# Patient Record
Sex: Male | Born: 1950 | Race: White | Hispanic: No | Marital: Married | Smoking: Former smoker
Health system: Southern US, Community
[De-identification: ages and names within clinical notes are randomized; demographics above are authoritative.]

## PROBLEM LIST (undated history)

## (undated) DIAGNOSIS — I10 Essential (primary) hypertension: Secondary | ICD-10-CM

## (undated) DIAGNOSIS — E785 Hyperlipidemia, unspecified: Secondary | ICD-10-CM

## (undated) DIAGNOSIS — E119 Type 2 diabetes mellitus without complications: Secondary | ICD-10-CM

## (undated) HISTORY — DX: Essential (primary) hypertension: I10

## (undated) HISTORY — DX: Hyperlipidemia, unspecified: E78.5

## (undated) HISTORY — PX: HAND SURGERY: SHX662

## (undated) HISTORY — DX: Type 2 diabetes mellitus without complications: E11.9

## (undated) HISTORY — PX: KNEE SURGERY: SHX244

## (undated) HISTORY — PX: FOOT SURGERY: SHX648

---

## 2000-02-27 ENCOUNTER — Encounter: Payer: Self-pay | Admitting: Neurosurgery

## 2000-02-27 ENCOUNTER — Encounter: Admission: RE | Admit: 2000-02-27 | Discharge: 2000-02-27 | Payer: Self-pay | Admitting: Neurosurgery

## 2000-08-25 ENCOUNTER — Ambulatory Visit (HOSPITAL_COMMUNITY): Admission: RE | Admit: 2000-08-25 | Discharge: 2000-08-25 | Payer: Self-pay | Admitting: Family Medicine

## 2000-08-25 ENCOUNTER — Encounter: Payer: Self-pay | Admitting: Family Medicine

## 2000-11-08 ENCOUNTER — Ambulatory Visit (HOSPITAL_COMMUNITY): Admission: RE | Admit: 2000-11-08 | Discharge: 2000-11-08 | Payer: Self-pay | Admitting: Family Medicine

## 2000-11-08 ENCOUNTER — Encounter: Payer: Self-pay | Admitting: Family Medicine

## 2002-06-01 ENCOUNTER — Encounter: Payer: Self-pay | Admitting: *Deleted

## 2002-06-01 ENCOUNTER — Emergency Department (HOSPITAL_COMMUNITY): Admission: EM | Admit: 2002-06-01 | Discharge: 2002-06-01 | Payer: Self-pay | Admitting: *Deleted

## 2019-07-06 ENCOUNTER — Inpatient Hospital Stay (HOSPITAL_COMMUNITY)
Admission: EM | Admit: 2019-07-06 | Discharge: 2019-07-08 | DRG: 066 | Disposition: A | Discharge status: Home / Self Care | Payer: Medicare Other | Attending: Internal Medicine | Admitting: Internal Medicine

## 2019-07-06 ENCOUNTER — Emergency Department (HOSPITAL_COMMUNITY): Payer: Medicare Other

## 2019-07-06 ENCOUNTER — Encounter (HOSPITAL_COMMUNITY): Payer: Self-pay | Admitting: Emergency Medicine

## 2019-07-06 ENCOUNTER — Other Ambulatory Visit: Payer: Self-pay

## 2019-07-06 DIAGNOSIS — I6521 Occlusion and stenosis of right carotid artery: Secondary | ICD-10-CM | POA: Diagnosis present

## 2019-07-06 DIAGNOSIS — Z8249 Family history of ischemic heart disease and other diseases of the circulatory system: Secondary | ICD-10-CM | POA: Diagnosis not present

## 2019-07-06 DIAGNOSIS — E119 Type 2 diabetes mellitus without complications: Secondary | ICD-10-CM

## 2019-07-06 DIAGNOSIS — Z91030 Bee allergy status: Secondary | ICD-10-CM

## 2019-07-06 DIAGNOSIS — Z7902 Long term (current) use of antithrombotics/antiplatelets: Secondary | ICD-10-CM | POA: Diagnosis not present

## 2019-07-06 DIAGNOSIS — Z20822 Contact with and (suspected) exposure to covid-19: Secondary | ICD-10-CM | POA: Diagnosis present

## 2019-07-06 DIAGNOSIS — E1165 Type 2 diabetes mellitus with hyperglycemia: Secondary | ICD-10-CM | POA: Diagnosis present

## 2019-07-06 DIAGNOSIS — Z79899 Other long term (current) drug therapy: Secondary | ICD-10-CM

## 2019-07-06 DIAGNOSIS — I10 Essential (primary) hypertension: Secondary | ICD-10-CM | POA: Diagnosis present

## 2019-07-06 DIAGNOSIS — F17210 Nicotine dependence, cigarettes, uncomplicated: Secondary | ICD-10-CM | POA: Diagnosis present

## 2019-07-06 DIAGNOSIS — R29810 Facial weakness: Secondary | ICD-10-CM | POA: Diagnosis present

## 2019-07-06 DIAGNOSIS — R4701 Aphasia: Secondary | ICD-10-CM | POA: Diagnosis not present

## 2019-07-06 DIAGNOSIS — I340 Nonrheumatic mitral (valve) insufficiency: Secondary | ICD-10-CM | POA: Diagnosis not present

## 2019-07-06 DIAGNOSIS — Z7289 Other problems related to lifestyle: Secondary | ICD-10-CM | POA: Diagnosis not present

## 2019-07-06 DIAGNOSIS — E785 Hyperlipidemia, unspecified: Secondary | ICD-10-CM | POA: Diagnosis not present

## 2019-07-06 DIAGNOSIS — I739 Peripheral vascular disease, unspecified: Secondary | ICD-10-CM | POA: Diagnosis not present

## 2019-07-06 DIAGNOSIS — I639 Cerebral infarction, unspecified: Secondary | ICD-10-CM | POA: Diagnosis not present

## 2019-07-06 DIAGNOSIS — E1365 Other specified diabetes mellitus with hyperglycemia: Secondary | ICD-10-CM | POA: Diagnosis not present

## 2019-07-06 DIAGNOSIS — I6381 Other cerebral infarction due to occlusion or stenosis of small artery: Principal | ICD-10-CM | POA: Diagnosis present

## 2019-07-06 DIAGNOSIS — Z716 Tobacco abuse counseling: Secondary | ICD-10-CM

## 2019-07-06 DIAGNOSIS — Z720 Tobacco use: Secondary | ICD-10-CM

## 2019-07-06 LAB — CBC WITH DIFFERENTIAL/PLATELET
Abs Immature Granulocytes: 0.02 10*3/uL (ref 0.00–0.07)
Basophils Absolute: 0.1 10*3/uL (ref 0.0–0.1)
Basophils Relative: 1 %
Eosinophils Absolute: 0.2 10*3/uL (ref 0.0–0.5)
Eosinophils Relative: 2 %
HCT: 47.5 % (ref 39.0–52.0)
Hemoglobin: 16.1 g/dL (ref 13.0–17.0)
Immature Granulocytes: 0 %
Lymphocytes Relative: 26 %
Lymphs Abs: 2.1 10*3/uL (ref 0.7–4.0)
MCH: 31.9 pg (ref 26.0–34.0)
MCHC: 33.9 g/dL (ref 30.0–36.0)
MCV: 94.1 fL (ref 80.0–100.0)
Monocytes Absolute: 0.7 10*3/uL (ref 0.1–1.0)
Monocytes Relative: 9 %
Neutro Abs: 4.9 10*3/uL (ref 1.7–7.7)
Neutrophils Relative %: 62 %
Platelets: 248 10*3/uL (ref 150–400)
RBC: 5.05 MIL/uL (ref 4.22–5.81)
RDW: 12.3 % (ref 11.5–15.5)
WBC: 8 10*3/uL (ref 4.0–10.5)
nRBC: 0 % (ref 0.0–0.2)

## 2019-07-06 LAB — COMPREHENSIVE METABOLIC PANEL
ALT: 24 U/L (ref 0–44)
AST: 19 U/L (ref 15–41)
Albumin: 3.9 g/dL (ref 3.5–5.0)
Alkaline Phosphatase: 88 U/L (ref 38–126)
Anion gap: 8 (ref 5–15)
BUN: 13 mg/dL (ref 8–23)
CO2: 28 mmol/L (ref 22–32)
Calcium: 8.9 mg/dL (ref 8.9–10.3)
Chloride: 102 mmol/L (ref 98–111)
Creatinine, Ser: 0.68 mg/dL (ref 0.61–1.24)
GFR calc Af Amer: >60 mL/min (ref >60)
GFR calc non Af Amer: >60 mL/min (ref >60)
Glucose, Bld: 189 mg/dL — ABNORMAL HIGH (ref 70–99)
Potassium: 3.7 mmol/L (ref 3.5–5.1)
Sodium: 138 mmol/L (ref 135–145)
Total Bilirubin: 0.6 mg/dL (ref 0.3–1.2)
Total Protein: 7.2 g/dL (ref 6.5–8.1)

## 2019-07-06 LAB — LIPID PANEL
Cholesterol: 203 mg/dL — ABNORMAL HIGH (ref 0–200)
HDL: 43 mg/dL (ref >40)
LDL Cholesterol: 141 mg/dL — ABNORMAL HIGH (ref 0–99)
Total CHOL/HDL Ratio: 4.7 RATIO
Triglycerides: 97 mg/dL (ref <150)
VLDL: 19 mg/dL (ref 0–40)

## 2019-07-06 LAB — HEMOGLOBIN A1C
Hgb A1c MFr Bld: 9 % — ABNORMAL HIGH (ref 4.8–5.6)
Mean Plasma Glucose: 211.6 mg/dL

## 2019-07-06 MED ORDER — AMLODIPINE BESYLATE 5 MG PO TABS
2.5000 mg | ORAL_TABLET | Freq: Once | ORAL | Status: AC
  Administered 2019-07-06: 2.5 mg via ORAL
  Filled 2019-07-06: qty 1

## 2019-07-06 MED ORDER — CLOPIDOGREL BISULFATE 75 MG PO TABS: 75.0000 mg | ORAL_TABLET | Freq: Every day | ORAL | 1 refills | Status: DC

## 2019-07-06 MED ORDER — ACETAMINOPHEN 160 MG/5ML PO SOLN: 650.0000 mg | ORAL | Status: DC | PRN

## 2019-07-06 MED ORDER — ASPIRIN EC 81 MG PO TBEC
81.0000 mg | DELAYED_RELEASE_TABLET | Freq: Every day | ORAL | Status: DC
  Administered 2019-07-07: 81 mg via ORAL
  Filled 2019-07-06: qty 1

## 2019-07-06 MED ORDER — ASPIRIN 81 MG PO CHEW
324.0000 mg | CHEWABLE_TABLET | Freq: Once | ORAL | Status: AC
  Administered 2019-07-06: 324 mg via ORAL
  Filled 2019-07-06: qty 4

## 2019-07-06 MED ORDER — IOHEXOL 350 MG/ML SOLN
100.0000 mL | Freq: Once | INTRAVENOUS | Status: AC | PRN
  Administered 2019-07-06: 80 mL via INTRAVENOUS

## 2019-07-06 MED ORDER — INSULIN ASPART 100 UNIT/ML ~~LOC~~ SOLN
0.0000 [IU] | Freq: Three times a day (TID) | SUBCUTANEOUS | Status: DC
  Administered 2019-07-07: 2 [IU] via SUBCUTANEOUS
  Administered 2019-07-07 – 2019-07-08 (×2): 3 [IU] via SUBCUTANEOUS
  Administered 2019-07-08: 5 [IU] via SUBCUTANEOUS

## 2019-07-06 MED ORDER — ACETAMINOPHEN 325 MG PO TABS: 650.0000 mg | ORAL_TABLET | ORAL | Status: DC | PRN

## 2019-07-06 MED ORDER — SENNOSIDES-DOCUSATE SODIUM 8.6-50 MG PO TABS: 1.0000 | ORAL_TABLET | Freq: Every evening | ORAL | Status: DC | PRN

## 2019-07-06 MED ORDER — HYDRALAZINE HCL 20 MG/ML IJ SOLN
10.0000 mg | INTRAMUSCULAR | Status: DC | PRN
  Administered 2019-07-07: 10 mg via INTRAVENOUS
  Filled 2019-07-06: qty 1

## 2019-07-06 MED ORDER — ATORVASTATIN CALCIUM 40 MG PO TABS
40.0000 mg | ORAL_TABLET | Freq: Every day | ORAL | Status: DC
  Administered 2019-07-06: 40 mg via ORAL
  Filled 2019-07-06: qty 1

## 2019-07-06 MED ORDER — AMLODIPINE BESYLATE 5 MG PO TABS
5.0000 mg | ORAL_TABLET | Freq: Every day | ORAL | Status: DC
  Administered 2019-07-06 – 2019-07-08 (×3): 5 mg via ORAL
  Filled 2019-07-06 (×3): qty 1

## 2019-07-06 MED ORDER — ACETAMINOPHEN 650 MG RE SUPP: 650.0000 mg | RECTAL | Status: DC | PRN

## 2019-07-06 MED ORDER — AMLODIPINE BESYLATE 2.5 MG PO TABS: 2.5000 mg | ORAL_TABLET | Freq: Every day | ORAL | 0 refills | Status: DC

## 2019-07-06 MED ORDER — HEPARIN SODIUM (PORCINE) 5000 UNIT/ML IJ SOLN
5000.0000 [IU] | Freq: Three times a day (TID) | INTRAMUSCULAR | Status: DC
  Administered 2019-07-06 – 2019-07-08 (×5): 5000 [IU] via SUBCUTANEOUS
  Filled 2019-07-06 (×5): qty 1

## 2019-07-06 MED ORDER — STROKE: EARLY STAGES OF RECOVERY BOOK
Freq: Once | Status: AC
  Administered 2019-07-06: 1
  Filled 2019-07-06: qty 1

## 2019-07-06 NOTE — ED Provider Notes (Signed)
  Physical Exam  BP (!) 206/98   Pulse 79   Temp 98.2 F (36.8 C) (Oral)   Resp 16   Ht 6\' 2"  (1.88 m)   Wt 97.5 kg   SpO2 95%   BMI 27.60 kg/m   Physical Exam  ED Course/Procedures     Procedures  MDM  Patient presents with slurred speech since Monday.  Initial head CT showed possible stroke.  MRI was recommended, however MRI machine is broken up here.  Patient will does not want to be admitted the hospital so CTA was done hopeful for outpatient follow-up after discussion with hospitalist and neurology.  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  Discussed with Dr. Tuesday with neurology.  Will require MRI short-term and not as an outpatient.  Will admit to hospitalist for transfer to [facility] since MRI is broken here.  Potentially will require vascular surgery if there are any findings on the MRI to go along with the right side findings on CT.       [facility], MD 07/06/19 [phone]

## 2019-07-06 NOTE — Discharge Instructions (Addendum)
Take 81 mg daily and plavix 75 mg daily and follow-up closely with neurology on Monday call for appointment. Take amlodipine as directed daily for blood pressure and have it rechecked next week. Return for any new or worsening stroke symptoms. Have blood pressure rechecked by primary doctor.  Follow up ultrasound results with neurology.

## 2019-07-06 NOTE — ED Notes (Signed)
Pt provided [employer] while awaiting transport to [facility] per MD request.

## 2019-07-06 NOTE — H&P (Addendum)
History and Physical    Samuel Bradshaw GNF:[id number] DOB: 04/02/1951 DOA: 07/06/2019  PCP: Patient, No Pcp Per   Patient coming from: Home  I have personally briefly reviewed patient's old medical records in [facility]  Chief Complaint: Speech abnormality  HPI: Samuel Bradshaw is a 69 y.o. male with medical history significant for hypertension, presented to the ED with complaints of slurred speech that started on Monday, 3 days ago, when he woke up in the morning, since onset, slurred speech has improved.  But he reports delay in forming words and verbalizing them. The next day- Tuesday, Spouse also noticed some facial droop which she thought involved the left side, but patient feels it involved the right side of his face.  He denies abnormal sensation on his face or extremities.  No weakness of his lower upper extremity.  He has been able to ambulate without assistance or difficulty. He has not seen a doctor in several years, he is not on any medications.  He does not take aspirin.  He currently smokes cigarettes.  ED Course: Blood pressure systolic elevated up to 204, unremarkable CBC, CMP. EKG shows sinus rhythm. Stat head CT-acute to subacute left periventricular white matter infarct suspected. Unable to do MRI at [facility] (not functioning). Initially patient did not want to be admitted to [facility].  Plan was to complete work-up with hemoglobin A1c lipid panel carotids and echo in the ED and discharged to home. But CTA head and neck showed positive bulky soft bleed at the right ICA bulb with high-grade stenosis numerically admitted at 70%, superimposed mild to mod distal right ICA stenosis. Also moderate to severe stenosis at the left ICA origin. EDP talked to Dr. Amada Jupiter, recommended admission to [facility] for MRI, pending imaging results may need vascular surgery evaluation also.  Review of Systems: As per HPI all other systems reviewed and negative.  History reviewed.  No pertinent past medical history.  History reviewed. No pertinent surgical history.   reports that he has been smoking cigarettes. He has a 25.00 pack-year smoking history. He has never used smokeless tobacco. He reports current alcohol use of about 6.0 standard drinks of alcohol per week. No history on file for drug.  Allergies  Allergen Reactions  . Bee Venom    Family history of hypertension, heart disease in father.  Prior to Admission medications   Medication Sig Start Date End Date Taking? Authorizing Provider  ibuprofen (ADVIL) 200 MG tablet Take 600 mg by mouth every 6 (six) hours as needed for moderate pain. -  as needed.   Yes [provider]  amLODipine (NORVASC) 2.5 MG tablet Take 1 tablet (2.5 mg total) by mouth daily. 07/06/19   Blane Ohara, MD  clopidogrel (PLAVIX) 75 MG tablet Take 1 tablet (75 mg total) by mouth daily. 07/06/19   Blane Ohara, MD    Physical Exam: Vitals:   07/06/19 1500 07/06/19 1600 07/06/19 1800 07/06/19 1801  BP: (!) 206/98  (!) 204/115   Pulse: 66 79  65  Resp: Temp:   97.8 F (36.6 C)   TempSrc:      SpO2: 96% 95%  100%  Weight:      Height:        Constitutional: NAD, calm, comfortable Vitals:   07/06/19 1500 07/06/19 1600 07/06/19 1800 07/06/19 1801  BP: (!) 206/98  (!) 204/115   Pulse: 66 79  65  Resp: 20 16 15  Temp:   97.8 F (36.6 C)   TempSrc:      SpO2: 96% 95%  100%  Weight:      Height:       Eyes: PERRL, lids and conjunctivae normal ENMT: Mucous membranes are moist.  Neck: normal, supple, no masses, no thyromegaly Respiratory: clear to auscultation bilaterally, no wheezing, no crackles. Normal respiratory effort. No accessory muscle use.  Cardiovascular: Regular rate and rhythm, no murmurs / rubs / gallops. No extremity edema. 2+ pedal pulses. No carotid bruits.  Abdomen: no tenderness, no masses palpated. No hepatosplenomegaly. Bowel sounds positive.  Musculoskeletal: no clubbing  / cyanosis. No joint deformity upper and lower extremities. Good ROM, no contractures. Normal muscle tone.  Skin: no rashes, lesions, ulcers. No induration Neurologic: Slurred speech not very apparent on my exam, speech understandable, mild right facial droop, central and peripheral vision intact, sensation intact face upper and lower extremities, normal finger-to-nose test, strength 5/5 in all 4, able to ambulate without assistance. Psychiatric: Normal judgment and insight. Alert and oriented x 3. Normal mood.   Labs on Admission: I have personally reviewed following labs and imaging studies  CBC: Recent Labs  Lab 07/06/19 1200  WBC 8.0  NEUTROABS 4.9  HGB 16.1  HCT 47.5  MCV 94.1  PLT 248   Basic Metabolic Panel: Recent Labs  Lab 07/06/19 1200  NA 138  K 3.7  CL 102  CO2 28  GLUCOSE 189*  BUN 13  CREATININE 0.68  CALCIUM 8.9   Liver Function Tests: Recent Labs  Lab 07/06/19 1200  AST 19  ALT 24  ALKPHOS 88  BILITOT 0.6  PROT 7.2  ALBUMIN 3.9   HbA1C: Recent Labs    07/06/19 1200  HGBA1C 9.0*   Lipid Profile: Recent Labs    07/06/19 1200  CHOL 203*  HDL 43  LDLCALC 141*  TRIG 97  CHOLHDL 4.7    Radiological Exams on Admission: CT Angio Head W or Wo Contrast  Result Date: 07/06/2019 CLINICAL DATA:  69 year old male with slurred speech upon waking today. EXAM: CT ANGIOGRAPHY HEAD AND NECK TECHNIQUE: Multidetector CT imaging of the head and neck was performed using the standard protocol during bolus administration of intravenous contrast. Multiplanar CT image reconstructions and MIPs were obtained to evaluate the vascular anatomy. Carotid stenosis measurements (when applicable) are obtained utilizing NASCET criteria, using the distal internal carotid diameter as the denominator. CONTRAST:  55mL OMNIPAQUE IOHEXOL 350 MG/ML SOLN COMPARISON:  Plain head CT 1219 hours today. FINDINGS: CT HEAD Brain: Hypodensity in the left basal ganglia appears somewhat  increased in conspicuity (series 4, image 17). No associated hemorrhage or mass effect. Mild for age cage in a white matter hypodensity elsewhere is stable. No midline shift, ventriculomegaly, mass effect, evidence of mass lesion, intracranial hemorrhage or evidence of cortically based acute infarction. No suspicious intracranial vascular hyperdensity. Calvarium and skull base: No acute osseous abnormality identified. Paranasal sinuses: Stable moderate to severe ethmoid and sphenoid sinus disease with mucosal thickening and opacification. Maxillary mucoperiosteal thickening. Tympanic cavities and mastoids remain well pneumatized. Orbits: No acute orbit or scalp soft tissue finding. CTA NECK Skeleton: Scattered dental caries. Lower cervical spine degeneration. No acute osseous abnormality identified. Upper chest: Negative. Other neck: Subcentimeter bilateral thyroid nodules do not meet consensus criteria for ultrasound follow-up. (Ref: J Am Coll Radiol. 2015 Feb;12(2): 143-50).Other neck soft tissues are within normal limits. Aortic arch: Calcified aortic atherosclerosis. Three vessel arch configuration. Right carotid system: Brachiocephalic artery and right CCA  origin plaque without stenosis. Only mild plaque at the right ICA origin but bulky circumferential soft plaque at the right ICA bulb resulting in stenosis numerically estimated at 70 % with respect to the distal vessel. See series 10, image 136 and series 9, image 222. Beyond this level the right ICA is patent with a normal caliber to the skull base. Left carotid system: Left CCA origin plaque without stenosis. Soft plaque in the medial left CCA proximal to the bifurcation without stenosis. Mild soft and calcified plaque in the proximal left ICA without stenosis to the skull base. Vertebral arteries: Minimal plaque in the proximal right subclavian artery without stenosis. Normal right vertebral artery origin. Patent right vertebral artery to the skull base  without stenosis. Bulky soft plaque in the proximal left subclavian artery (series 11, image 128) with 50 % stenosis with respect to the distal vessel. Mild soft plaque at the left vertebral artery origin without stenosis. The left vertebral appears mildly dominant and is then patent to the skull base without additional plaque. CTA HEAD Posterior circulation: Dominant left V4 segment. Mild to moderate left V4 segment plaque without significant stenosis. Patent PICA origins. No right V4 stenosis. Patent vertebrobasilar junction. Patent basilar artery without stenosis. Patent AICA, SCA and PCA origins. Posterior communicating arteries are diminutive or absent. Bilateral PCA branches are within normal limits. Anterior circulation: Both ICA siphons are patent. On the left there is mild calcified plaque without stenosis. Normal left ophthalmic artery origin. On the right there is moderate calcified plaque with mild to moderate supraclinoid segment stenosis (series 9, image 119). Normal right ophthalmic artery origin. Patent carotid termini. Normal MCA and right ACA origins. There is a moderate to severe stenosis at the left ACA origin (series 13, image 19). Anterior communicating artery and bilateral ACA branches are within normal limits. Right MCA M1 segment and bifurcation are patent without stenosis. Right MCA branches are within normal limits. Left MCA M1 segment and bifurcation are patent without stenosis. There is mild irregularity of the dominant left M2 branch on series 14, image 29 without significant stenosis. Venous sinuses: Patent, prominent left arachnoid granulation at the junction of the transverse and sigmoid sinuses (normal variant). Anatomic variants: Dominant left vertebral artery. Review of the MIP images confirms the above findings IMPRESSION: 1. Negative for large vessel occlusion. 2. Positive for bulky soft plaque at the Right ICA bulb with high-grade stenosis numerically estimated at 70%.  Superimposed mild to moderate distal right ICA supraclinoid stenosis due to calcified plaque. 3. Moderate to severe stenosis at the Left ACA origin. 4. No other hemodynamically significant large vessel stenosis. 5. Left basal ganglia lacunar infarct with no hemorrhage or mass effect may be acute to subacute. 6. No new intracranial abnormality. 7. Bilateral paranasal sinusitis. 8. Aortic Atherosclerosis (ICD10-I70.0). Electronically Signed   By: Genevie Ann M.D.   On: 07/06/2019 17:00   CT Head Wo Contrast  Result Date: 07/06/2019 CLINICAL DATA:  Woke up Monday morning with slurred speech. Still slightly slurred speech. EXAM: CT HEAD WITHOUT CONTRAST TECHNIQUE: Contiguous axial images were obtained from the base of the skull through the vertex without intravenous contrast. COMPARISON:  None. FINDINGS: Brain: The ventricles are in the midline without mass effect or shift. No extra-axial fluid collections identified. Patchy white matter changes, likely microvascular ischemic change. There is a slightly rounded area of low attenuation in the deep white periventricular white matter suspicious for a acute or subacute infarct. MR imaging is suggested for further evaluation. No  hemispheric infarction or intracranial hemorrhage. The brainstem and cerebellum appear normal. Vascular: Scattered vascular calcifications. No definite hyperdense vessels or aneurysm. Skull: No skull fracture or bone lesions. Sinuses/Orbits: Near complete opacification of the sphenoid sinus and fairly marked mucoperiosteal thickening involving the ethmoid air cells and maxillary sinuses. Scattered mastoid effusions are noted on the left side. Other: No scalp lesions or hematoma. IMPRESSION: 1. Suspect acute or subacute left periventricular white matter infarct. Recommend MR imaging for further evaluation. 2. No findings for hemispheric infarction or intracranial hemorrhage. 3. Paranasal sinus disease and left-sided mastoid effusions. Electronically  Signed   By: Rudie Meyer M.D.   On: 07/06/2019 12:31   CT Angio Neck W and/or Wo Contrast  Result Date: 07/06/2019 CLINICAL DATA:  69 year old male with slurred speech upon waking today. EXAM: CT ANGIOGRAPHY HEAD AND NECK TECHNIQUE: Multidetector CT imaging of the head and neck was performed using the standard protocol during bolus administration of intravenous contrast. Multiplanar CT image reconstructions and MIPs were obtained to evaluate the vascular anatomy. Carotid stenosis measurements (when applicable) are obtained utilizing NASCET criteria, using the distal internal carotid diameter as the denominator. CONTRAST:  19mL OMNIPAQUE IOHEXOL 350 MG/ML SOLN COMPARISON:  Plain head CT 1219 hours today. FINDINGS: CT HEAD Brain: Hypodensity in the left basal ganglia appears somewhat increased in conspicuity (series 4, image 17). No associated hemorrhage or mass effect. Mild for age cage in a white matter hypodensity elsewhere is stable. No midline shift, ventriculomegaly, mass effect, evidence of mass lesion, intracranial hemorrhage or evidence of cortically based acute infarction. No suspicious intracranial vascular hyperdensity. Calvarium and skull base: No acute osseous abnormality identified. Paranasal sinuses: Stable moderate to severe ethmoid and sphenoid sinus disease with mucosal thickening and opacification. Maxillary mucoperiosteal thickening. Tympanic cavities and mastoids remain well pneumatized. Orbits: No acute orbit or scalp soft tissue finding. CTA NECK Skeleton: Scattered dental caries. Lower cervical spine degeneration. No acute osseous abnormality identified. Upper chest: Negative. Other neck: Subcentimeter bilateral thyroid nodules do not meet consensus criteria for ultrasound follow-up. (Ref: J Am Coll Radiol. 2015 Feb;12(2): 143-50).Other neck soft tissues are within normal limits. Aortic arch: Calcified aortic atherosclerosis. Three vessel arch configuration. Right carotid system:  Brachiocephalic artery and right CCA origin plaque without stenosis. Only mild plaque at the right ICA origin but bulky circumferential soft plaque at the right ICA bulb resulting in stenosis numerically estimated at 70 % with respect to the distal vessel. See series 10, image 136 and series 9, image 222. Beyond this level the right ICA is patent with a normal caliber to the skull base. Left carotid system: Left CCA origin plaque without stenosis. Soft plaque in the medial left CCA proximal to the bifurcation without stenosis. Mild soft and calcified plaque in the proximal left ICA without stenosis to the skull base. Vertebral arteries: Minimal plaque in the proximal right subclavian artery without stenosis. Normal right vertebral artery origin. Patent right vertebral artery to the skull base without stenosis. Bulky soft plaque in the proximal left subclavian artery (series 11, image 128) with 50 % stenosis with respect to the distal vessel. Mild soft plaque at the left vertebral artery origin without stenosis. The left vertebral appears mildly dominant and is then patent to the skull base without additional plaque. CTA HEAD Posterior circulation: Dominant left V4 segment. Mild to moderate left V4 segment plaque without significant stenosis. Patent PICA origins. No right V4 stenosis. Patent vertebrobasilar junction. Patent basilar artery without stenosis. Patent AICA, SCA and PCA origins. Posterior  communicating arteries are diminutive or absent. Bilateral PCA branches are within normal limits. Anterior circulation: Both ICA siphons are patent. On the left there is mild calcified plaque without stenosis. Normal left ophthalmic artery origin. On the right there is moderate calcified plaque with mild to moderate supraclinoid segment stenosis (series 9, image 119). Normal right ophthalmic artery origin. Patent carotid termini. Normal MCA and right ACA origins. There is a moderate to severe stenosis at the left ACA  origin (series 13, image 19). Anterior communicating artery and bilateral ACA branches are within normal limits. Right MCA M1 segment and bifurcation are patent without stenosis. Right MCA branches are within normal limits. Left MCA M1 segment and bifurcation are patent without stenosis. There is mild irregularity of the dominant left M2 branch on series 14, image 29 without significant stenosis. Venous sinuses: Patent, prominent left arachnoid granulation at the junction of the transverse and sigmoid sinuses (normal variant). Anatomic variants: Dominant left vertebral artery. Review of the MIP images confirms the above findings IMPRESSION: 1. Negative for large vessel occlusion. 2. Positive for bulky soft plaque at the Right ICA bulb with high-grade stenosis numerically estimated at 70%. Superimposed mild to moderate distal right ICA supraclinoid stenosis due to calcified plaque. 3. Moderate to severe stenosis at the Left ACA origin. 4. No other hemodynamically significant large vessel stenosis. 5. Left basal ganglia lacunar infarct with no hemorrhage or mass effect may be acute to subacute. 6. No new intracranial abnormality. 7. Bilateral paranasal sinusitis. 8. Aortic Atherosclerosis (ICD10-I70.0). Electronically Signed   By: Odessa FlemingH  Hall M.D.   On: 07/06/2019 17:00    EKG: Independently reviewed. Sinus rhythm rate 67, QTC 476.  Assessment/Plan Principal Problem:   Acute CVA (cerebrovascular accident) [facility](HCC) Active Problems:   Essential hypertension   Dyslipidemia   Diabetes mellitus (HCC)   Tobacco use   Acute CVA-slurred speech, right facial droop. Head CT suspect acute to subacute left periventricular white matter infarct. Subsequent CTA head and neck-positive for bulky soft plaque at the right ICA bulb with high-grade stenosis-70% and moderate to severe left ACA stenosis.  Aspirin nave. -[facility], needs [facility] not available [facility] -MRI brain without contrast -Follow-up neurology  recommendation -Hemoglobin A1c- 9 -Lipid panel-total cholesterol 203, LDL 141, triglycerides of 97. -Start atorvastatin 40 mg daily -Aspirin 325 given, cont 81 mg daily -Echocardiogram -PT, OT, speech therapy evaluation -Risk factor modification  Hypertension-elevated systolic up to 204.  Undiagnosed and not on medication. -Will start hypertensives with Norvasc at 5 mg daily, ( > than 72 hours from onset of symptoms) -IV hydralazine 10 mg for systolic greater than 180  Newly diagnosed diabetes mellitus-random glucose 189, hemoglobin A1c today 9. - SSI- s  -Will need oral medications on discharge, and to establish care with primary care provider  New diagnosis dyslipidemia-total cholesterol of 203, LDL of 141. -Start atorvastatin 40 mg daily  Tobacco use -Counseled on cessation   DVT prophylaxis: Heparin Code Status: Full code Family Communication: Spouse at bedside.  Disposition Plan: ~ 2 days Consults called: Neurology Admission status: Inpatient, telemetry I certify that at the point of admission it is my clinical judgment that the patient will require inpatient hospital care spanning beyond 2 midnights from the point of admission due to high intensity of service, high risk for further deterioration and high frequency of surveillance required. The following factors support the patient status of inpatient: Evidence of acute infarct on CT, hence inpatient status.   Onnie BoerEjiroghene E Sharicka Pogorzelski MD  [facility]  07/06/2019, 7:52 PM

## 2019-07-06 NOTE — ED Notes (Signed)
Report given to [facility]. ETA .

## 2019-07-06 NOTE — ED Provider Notes (Signed)
[facility] EMERGENCY DEPARTMENT Provider Note   CSN: [id number] Arrival date & time: 07/06/19  3532     History No chief complaint on file.   Samuel Bradshaw is a 69 y.o. male.  Patient presents with slurred speech since Monday morning.  Patient woke up with it.  No history of stroke.  Patient denies any significant medical history.  Patient denies any head injury.  Patient denies unilateral muscular symptoms or weakness, no vision loss.  No concerning headaches.        History reviewed. No pertinent past medical history.  There are no problems to display for this patient.   History reviewed. No pertinent surgical history.     History reviewed. No pertinent family history.  Social History   Tobacco Use  . Smoking status: Current Every Day Smoker    Packs/day: 1.00    Years: 25.00    Pack years: 25.00    Types: Cigarettes  . Smokeless tobacco: Never Used  Substance Use Topics  . Alcohol use: Yes    Alcohol/week: 6.0 standard drinks    Types: 6 Cans of beer per week    Comment: 6 beers a week  . Drug use: Not on file    Home Medications Prior to Admission medications   Medication Sig Start Date End Date Taking? Authorizing Provider  ibuprofen (ADVIL) 200 MG tablet Take 600 mg by mouth every 6 (six) hours as needed for moderate pain. 400mg -600mg  as needed.   Yes [provider]    Allergies    Bee venom  Review of Systems   Review of Systems  Constitutional: Negative for chills and fever.  HENT: Negative for congestion.   Eyes: Negative for visual disturbance.  Respiratory: Negative for shortness of breath.   Cardiovascular: Negative for chest pain.  Gastrointestinal: Negative for abdominal pain and vomiting.  Genitourinary: Negative for dysuria and flank pain.  Musculoskeletal: Negative for back pain, neck pain and neck stiffness.  Skin: Negative for rash.  Neurological: Positive for speech difficulty. Negative for syncope, weakness,  light-headedness and headaches.    Physical Exam Updated Vital Signs BP (!) 211/128 (BP Location: Right Arm)   Pulse 73   Temp 98.2 F (36.8 C) (Oral)   Resp 18   Ht 6\' 2"  (1.88 m)   Wt 97.5 kg   SpO2 100%   BMI 27.60 kg/m   Physical Exam Vitals and nursing note reviewed.  Constitutional:      Appearance: He is well-developed.  HENT:     Head: Normocephalic and atraumatic.  Eyes:     General:        Right eye: No discharge.        Left eye: No discharge.     Conjunctiva/sclera: Conjunctivae normal.  Neck:     Trachea: No tracheal deviation.  Cardiovascular:     Rate and Rhythm: Normal rate and regular rhythm.  Pulmonary:     Effort: Pulmonary effort is normal.     Breath sounds: Normal breath sounds.  Abdominal:     General: There is no distension.     Palpations: Abdomen is soft.     Tenderness: There is no abdominal tenderness. There is no guarding.  Musculoskeletal:        General: Normal range of motion.     Cervical back: Normal range of motion and neck supple.  Skin:    General: Skin is warm.     Findings: No rash.  Neurological:  Mental Status: He is alert and oriented to person, place, and time.     Comments: 5+ strength in UE and LE with f/e at major joints. Sensation to palpation intact in UE and LE. CNs 2-12 grossly intact.  EOMFI.  PERRL.   Finger nose and coordination intact bilateral.   Visual fields intact to finger testing. No nystagmus Patient has very mild slurred speech on exam.     ED Results / Procedures / Treatments   Labs (all labs ordered are listed, but only abnormal results are displayed) Labs Reviewed  COMPREHENSIVE METABOLIC PANEL - Abnormal; Notable for the following components:      Result Value   Glucose, Bld 189 (*)    All other components within normal limits  CBC WITH DIFFERENTIAL/PLATELET  LIPID PANEL  HEMOGLOBIN A1C    EKG EKG Interpretation  Date/Time:  Thursday July 06 2019 10:54:31 EDT Ventricular  Rate:  67 PR Interval:    QRS Duration: 86 QT Interval:  450 QTC Calculation: 476 R Axis:   57 Text Interpretation: Sinus rhythm Consider left atrial enlargement Anterior infarct, old Confirmed by Elnora Morrison [phone]) on 07/06/2019 11:59:35 AM   Radiology CT Head Wo Contrast  Result Date: 07/06/2019 CLINICAL DATA:  Woke up Monday morning with slurred speech. Still slightly slurred speech. EXAM: CT HEAD WITHOUT CONTRAST TECHNIQUE: Contiguous axial images were obtained from the base of the skull through the vertex without intravenous contrast. COMPARISON:  None. FINDINGS: Brain: The ventricles are in the midline without mass effect or shift. No extra-axial fluid collections identified. Patchy white matter changes, likely microvascular ischemic change. There is a slightly rounded area of low attenuation in the deep white periventricular white matter suspicious for a acute or subacute infarct. MR imaging is suggested for further evaluation. No hemispheric infarction or intracranial hemorrhage. The brainstem and cerebellum appear normal. Vascular: Scattered vascular calcifications. No definite hyperdense vessels or aneurysm. Skull: No skull fracture or bone lesions. Sinuses/Orbits: Near complete opacification of the sphenoid sinus and fairly marked mucoperiosteal thickening involving the ethmoid air cells and maxillary sinuses. Scattered mastoid effusions are noted on the left side. Other: No scalp lesions or hematoma. IMPRESSION: 1. Suspect acute or subacute left periventricular white matter infarct. Recommend MR imaging for further evaluation. 2. No findings for hemispheric infarction or intracranial hemorrhage. 3. Paranasal sinus disease and left-sided mastoid effusions. Electronically Signed   By: Marijo Sanes M.D.   On: 07/06/2019 12:31    Procedures Procedures (including critical care time)  Medications Ordered in ED Medications  aspirin chewable tablet 324 mg (324 mg Oral Given 07/06/19 1339)      ED Course  I have reviewed the triage vital signs and the nursing notes.  Pertinent labs & imaging results that were available during my care of the patient were reviewed by me and considered in my medical decision making (see chart for details).    MDM Rules/Calculators/A&P                      Patient presents with clinical concern for occult stroke with speech changes since Monday.  Patient out of any time window for TPA or emergent neuro consult.  Attempted to obtain MRI however MRI machine not functioning.  CT scan obtained and concern for occult stroke.  Blood work ordered and reviewed no acute abnormalities normal white blood cell count, normal hemoglobin, normal kidney function.  Paged hospitalist and neurology for likely transfer to [facility] for MRI and further  work-up for stroke including echo/carotids.  Discussed with patient and his wife and he prefers to stay local and do outpatient work-up.  Discussed with hospitalist recommended adding lipid panel, ultrasound carotids, echo done in the ER and can follow-up results with neurology in the clinic.  Patient comfortable this plan.  Nursing calling for ultrasound to be performed.  Final Clinical Impression(s) / ED Diagnoses Final diagnoses:  Cerebrovascular accident (CVA), unspecified mechanism (HCC)  Expressive aphasia    Rx / DC Orders ED Discharge Orders    None       Blane Ohara, MD 07/06/19 1342

## 2019-07-06 NOTE — ED Triage Notes (Signed)
Patient states he woke up Monday morning with slurred speech.  Patient states he can't think as fast as he use to.  Still states he feels like his speech is slurred.

## 2019-07-07 ENCOUNTER — Inpatient Hospital Stay (HOSPITAL_COMMUNITY): Payer: Medicare Other

## 2019-07-07 DIAGNOSIS — I6521 Occlusion and stenosis of right carotid artery: Secondary | ICD-10-CM

## 2019-07-07 DIAGNOSIS — I639 Cerebral infarction, unspecified: Secondary | ICD-10-CM

## 2019-07-07 DIAGNOSIS — I340 Nonrheumatic mitral (valve) insufficiency: Secondary | ICD-10-CM

## 2019-07-07 DIAGNOSIS — E1365 Other specified diabetes mellitus with hyperglycemia: Secondary | ICD-10-CM

## 2019-07-07 DIAGNOSIS — E119 Type 2 diabetes mellitus without complications: Secondary | ICD-10-CM

## 2019-07-07 DIAGNOSIS — I739 Peripheral vascular disease, unspecified: Secondary | ICD-10-CM

## 2019-07-07 LAB — SARS CORONAVIRUS 2 (TAT 6-24 HRS): SARS Coronavirus 2: NEGATIVE

## 2019-07-07 LAB — GLUCOSE, CAPILLARY
Glucose-Capillary: 240 mg/dL — ABNORMAL HIGH (ref 70–99)
Glucose-Capillary: 156 mg/dL — ABNORMAL HIGH (ref 70–99)
Glucose-Capillary: 256 mg/dL — ABNORMAL HIGH (ref 70–99)

## 2019-07-07 LAB — ECHOCARDIOGRAM COMPLETE
Height: 74 in
Weight: 3440 oz

## 2019-07-07 LAB — HIV ANTIBODY (ROUTINE TESTING W REFLEX): HIV Screen 4th Generation wRfx: NONREACTIVE

## 2019-07-07 MED ORDER — ASPIRIN EC 325 MG PO TBEC
325.0000 mg | DELAYED_RELEASE_TABLET | Freq: Every day | ORAL | Status: DC
  Administered 2019-07-08: 325 mg via ORAL
  Filled 2019-07-07: qty 1

## 2019-07-07 MED ORDER — INSULIN GLARGINE 100 UNIT/ML ~~LOC~~ SOLN
8.0000 [IU] | Freq: Every day | SUBCUTANEOUS | Status: DC
  Administered 2019-07-07: 8 [IU] via SUBCUTANEOUS
  Filled 2019-07-07 (×2): qty 0.08

## 2019-07-07 MED ORDER — ATORVASTATIN CALCIUM 80 MG PO TABS
80.0000 mg | ORAL_TABLET | Freq: Every day | ORAL | Status: DC
  Administered 2019-07-07: 80 mg via ORAL
  Filled 2019-07-07: qty 1

## 2019-07-07 MED ORDER — GLUCERNA SHAKE PO LIQD
237.0000 mL | Freq: Three times a day (TID) | ORAL | Status: DC
  Administered 2019-07-07 – 2019-07-08 (×3): 237 mL via ORAL
  Filled 2019-07-07: qty 237

## 2019-07-07 MED ORDER — ADULT MULTIVITAMIN W/MINERALS CH
1.0000 | ORAL_TABLET | Freq: Every day | ORAL | Status: DC
  Administered 2019-07-07 – 2019-07-08 (×2): 1 via ORAL
  Filled 2019-07-07: qty 1

## 2019-07-07 NOTE — Progress Notes (Addendum)
Patient arrived in the unit at 2157 pm from [facility] ED, escorted by [facility] staffs, A&O x4, resting in a bed at this moment, and will continue to monitor.  Paged admission placement [facility] &  Neurologist.

## 2019-07-07 NOTE — Plan of Care (Signed)

## 2019-07-07 NOTE — Progress Notes (Signed)
SLP Cancellation Note  Patient Details Name: Samuel Bradshaw MRN: [id number] DOB: 1950/10/05   Cancelled treatment:       Reason Eval/Treat Not Completed: Patient at procedure or test/unavailable (having testing done in room). Will f/u as able.    Mahala Menghini., M.A. CCC-SLP Acute Rehabilitation Services Pager [phone] Office [phone]  07/07/2019, 10:43 AM

## 2019-07-07 NOTE — Consult Note (Signed)
Requesting Physician: Dr. Rosalva Ferron    Chief Complaint: Slurred speech, left facial droop  History obtained from: Patient and Chart   HPI:                                                                                                                                       Samuel Bradshaw is a 69 y.o. male with past medical history significant for hypertension presents to the emergency department with slurred speech that began 3 days ago.  According to the patient, on Monday he noticed his speech was slurred when he woke up in the morning and has continued to be slurred although they have gradually improved.  There is also some concern of left facial droop noted by his wife.  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  Patient transferred to [facility] for MRI brain and possible vascular surgery consult   Date last known well: 3.21.22 tPA Given: No, outside window NIHSS: 0 Baseline MRS  Past medical history -Hypertension    History reviewed. No pertinent family history.   Social History:  reports that he has been smoking cigarettes. He has a 25.00 pack-year smoking history. He has never used smokeless tobacco. He reports current alcohol use of about 6.0 standard drinks of alcohol per week. No history on file for drug.  Allergies:  Allergies  Allergen Reactions   Bee Venom     Medications:                                                                                                                        I reviewed home medications   ROS:  14 systems reviewed and negative except above    Examination:                                                                                                      General: Appears  well-developed  Psych: Affect appropriate to situation Eyes: No scleral injection HENT: No OP obstrucion Head: Normocephalic.  Cardiovascular: Normal rate and regular rhythm.  Respiratory: Effort normal and breath sounds normal to anterior ascultation GI: Soft.  No distension. There is no tenderness.  Skin: WDI    Neurological Examination Mental Status: Alert, oriented, thought content appropriate.  Speech fluent without evidence of aphasia. Able to follow 3 step commands without difficulty. Cranial Nerves: II: Visual fields grossly normal,  III,IV, VI: ptosis not present, extra-ocular motions intact bilaterally, pupils equal, round, reactive to light and accommodation V,VII: smile symmetric, facial light touch sensation normal bilaterally VIII: hearing normal bilaterally IX,X: uvula rises symmetrically XI: bilateral shoulder shrug XII: midline tongue extension Motor: Right : Upper extremity   5/5    Left:     Upper extremity   5/5  Lower extremity   5/5     Lower extremity   5/5 Tone and bulk:normal tone throughout; no atrophy noted Sensory: Pinprick and light touch intact throughout, bilaterally Deep Tendon Reflexes: 2+ and symmetric throughout Plantars: Right: downgoing   Left: downgoing Cerebellar: normal finger-to-nose, normal rapid alternating movements and normal heel-to-shin test Gait: normal gait and station     Lab Results: Basic Metabolic Panel: Recent Labs  Lab 07/06/19 1200  NA 138  K 3.7  CL 102  CO2 28  GLUCOSE 189*  BUN 13  CREATININE 0.68  CALCIUM 8.9    CBC: Recent Labs  Lab 07/06/19 1200  WBC 8.0  NEUTROABS 4.9  HGB 16.1  HCT 47.5  MCV 94.1  PLT 248    Coagulation Studies: No results for input(s): LABPROT, INR in the last 72 hours.  Imaging: CT Angio Head W or Wo Contrast  Result Date: 07/06/2019 CLINICAL DATA:  69 year old male with slurred speech upon waking today. EXAM: CT ANGIOGRAPHY HEAD AND NECK TECHNIQUE: Multidetector CT  imaging of the head and neck was performed using the standard protocol during bolus administration of intravenous contrast. Multiplanar CT image reconstructions and MIPs were obtained to evaluate the vascular anatomy. Carotid stenosis measurements (when applicable) are obtained utilizing NASCET criteria, using the distal internal carotid diameter as the denominator. CONTRAST:  65mL OMNIPAQUE IOHEXOL 350 MG/ML SOLN COMPARISON:  Plain head CT 1219 hours today. FINDINGS: CT HEAD Brain: Hypodensity in the left basal ganglia appears somewhat increased in conspicuity (series 4, image 17). No associated hemorrhage or mass effect. Mild for age cage in a white matter hypodensity elsewhere is stable. No midline shift, ventriculomegaly, mass effect, evidence of mass lesion, intracranial hemorrhage or evidence of cortically based acute infarction. No suspicious intracranial vascular hyperdensity. Calvarium and skull base: No acute osseous abnormality identified. Paranasal sinuses: Stable moderate to severe ethmoid and sphenoid sinus disease with mucosal thickening and opacification. Maxillary mucoperiosteal thickening. Tympanic cavities and mastoids remain well pneumatized.  Orbits: No acute orbit or scalp soft tissue finding. CTA NECK Skeleton: Scattered dental caries. Lower cervical spine degeneration. No acute osseous abnormality identified. Upper chest: Negative. Other neck: Subcentimeter bilateral thyroid nodules do not meet consensus criteria for ultrasound follow-up. (Ref: J Am Coll Radiol. 2015 Feb;12(2): 143-50).Other neck soft tissues are within normal limits. Aortic arch: Calcified aortic atherosclerosis. Three vessel arch configuration. Right carotid system: Brachiocephalic artery and right CCA origin plaque without stenosis. Only mild plaque at the right ICA origin but bulky circumferential soft plaque at the right ICA bulb resulting in stenosis numerically estimated at 70 % with respect to the distal vessel. See  series 10, image 136 and series 9, image 222. Beyond this level the right ICA is patent with a normal caliber to the skull base. Left carotid system: Left CCA origin plaque without stenosis. Soft plaque in the medial left CCA proximal to the bifurcation without stenosis. Mild soft and calcified plaque in the proximal left ICA without stenosis to the skull base. Vertebral arteries: Minimal plaque in the proximal right subclavian artery without stenosis. Normal right vertebral artery origin. Patent right vertebral artery to the skull base without stenosis. Bulky soft plaque in the proximal left subclavian artery (series 11, image 128) with 50 % stenosis with respect to the distal vessel. Mild soft plaque at the left vertebral artery origin without stenosis. The left vertebral appears mildly dominant and is then patent to the skull base without additional plaque. CTA HEAD Posterior circulation: Dominant left V4 segment. Mild to moderate left V4 segment plaque without significant stenosis. Patent PICA origins. No right V4 stenosis. Patent vertebrobasilar junction. Patent basilar artery without stenosis. Patent AICA, SCA and PCA origins. Posterior communicating arteries are diminutive or absent. Bilateral PCA branches are within normal limits. Anterior circulation: Both ICA siphons are patent. On the left there is mild calcified plaque without stenosis. Normal left ophthalmic artery origin. On the right there is moderate calcified plaque with mild to moderate supraclinoid segment stenosis (series 9, image 119). Normal right ophthalmic artery origin. Patent carotid termini. Normal MCA and right ACA origins. There is a moderate to severe stenosis at the left ACA origin (series 13, image 19). Anterior communicating artery and bilateral ACA branches are within normal limits. Right MCA M1 segment and bifurcation are patent without stenosis. Right MCA branches are within normal limits. Left MCA M1 segment and bifurcation are  patent without stenosis. There is mild irregularity of the dominant left M2 branch on series 14, image 29 without significant stenosis. Venous sinuses: Patent, prominent left arachnoid granulation at the junction of the transverse and sigmoid sinuses (normal variant). Anatomic variants: Dominant left vertebral artery. Review of the MIP images confirms the above findings IMPRESSION: 1. Negative for large vessel occlusion. 2. Positive for bulky soft plaque at the Right ICA bulb with high-grade stenosis numerically estimated at 70%. Superimposed mild to moderate distal right ICA supraclinoid stenosis due to calcified plaque. 3. Moderate to severe stenosis at the Left ACA origin. 4. No other hemodynamically significant large vessel stenosis. 5. Left basal ganglia lacunar infarct with no hemorrhage or mass effect may be acute to subacute. 6. No new intracranial abnormality. 7. Bilateral paranasal sinusitis. 8. Aortic Atherosclerosis (ICD10-I70.0). Electronically Signed   By: Odessa Fleming M.D.   On: 07/06/2019 17:00   CT Head Wo Contrast  Result Date: 07/06/2019 CLINICAL DATA:  Woke up Monday morning with slurred speech. Still slightly slurred speech. EXAM: CT HEAD WITHOUT CONTRAST TECHNIQUE: Contiguous axial images were  obtained from the base of the skull through the vertex without intravenous contrast. COMPARISON:  None. FINDINGS: Brain: The ventricles are in the midline without mass effect or shift. No extra-axial fluid collections identified. Patchy white matter changes, likely microvascular ischemic change. There is a slightly rounded area of low attenuation in the deep white periventricular white matter suspicious for a acute or subacute infarct. MR imaging is suggested for further evaluation. No hemispheric infarction or intracranial hemorrhage. The brainstem and cerebellum appear normal. Vascular: Scattered vascular calcifications. No definite hyperdense vessels or aneurysm. Skull: No skull fracture or bone  lesions. Sinuses/Orbits: Near complete opacification of the sphenoid sinus and fairly marked mucoperiosteal thickening involving the ethmoid air cells and maxillary sinuses. Scattered mastoid effusions are noted on the left side. Other: No scalp lesions or hematoma. IMPRESSION: 1. Suspect acute or subacute left periventricular white matter infarct. Recommend MR imaging for further evaluation. 2. No findings for hemispheric infarction or intracranial hemorrhage. 3. Paranasal sinus disease and left-sided mastoid effusions. Electronically Signed   By: Rudie Meyer M.D.   On: 07/06/2019 12:31   CT Angio Neck W and/or Wo Contrast  Result Date: 07/06/2019 CLINICAL DATA:  69 year old male with slurred speech upon waking today. EXAM: CT ANGIOGRAPHY HEAD AND NECK TECHNIQUE: Multidetector CT imaging of the head and neck was performed using the standard protocol during bolus administration of intravenous contrast. Multiplanar CT image reconstructions and MIPs were obtained to evaluate the vascular anatomy. Carotid stenosis measurements (when applicable) are obtained utilizing NASCET criteria, using the distal internal carotid diameter as the denominator. CONTRAST:  62mL OMNIPAQUE IOHEXOL 350 MG/ML SOLN COMPARISON:  Plain head CT 1219 hours today. FINDINGS: CT HEAD Brain: Hypodensity in the left basal ganglia appears somewhat increased in conspicuity (series 4, image 17). No associated hemorrhage or mass effect. Mild for age cage in a white matter hypodensity elsewhere is stable. No midline shift, ventriculomegaly, mass effect, evidence of mass lesion, intracranial hemorrhage or evidence of cortically based acute infarction. No suspicious intracranial vascular hyperdensity. Calvarium and skull base: No acute osseous abnormality identified. Paranasal sinuses: Stable moderate to severe ethmoid and sphenoid sinus disease with mucosal thickening and opacification. Maxillary mucoperiosteal thickening. Tympanic cavities and  mastoids remain well pneumatized. Orbits: No acute orbit or scalp soft tissue finding. CTA NECK Skeleton: Scattered dental caries. Lower cervical spine degeneration. No acute osseous abnormality identified. Upper chest: Negative. Other neck: Subcentimeter bilateral thyroid nodules do not meet consensus criteria for ultrasound follow-up. (Ref: J Am Coll Radiol. 2015 Feb;12(2): 143-50).Other neck soft tissues are within normal limits. Aortic arch: Calcified aortic atherosclerosis. Three vessel arch configuration. Right carotid system: Brachiocephalic artery and right CCA origin plaque without stenosis. Only mild plaque at the right ICA origin but bulky circumferential soft plaque at the right ICA bulb resulting in stenosis numerically estimated at 70 % with respect to the distal vessel. See series 10, image 136 and series 9, image 222. Beyond this level the right ICA is patent with a normal caliber to the skull base. Left carotid system: Left CCA origin plaque without stenosis. Soft plaque in the medial left CCA proximal to the bifurcation without stenosis. Mild soft and calcified plaque in the proximal left ICA without stenosis to the skull base. Vertebral arteries: Minimal plaque in the proximal right subclavian artery without stenosis. Normal right vertebral artery origin. Patent right vertebral artery to the skull base without stenosis. Bulky soft plaque in the proximal left subclavian artery (series 11, image 128) with 50 % stenosis with  respect to the distal vessel. Mild soft plaque at the left vertebral artery origin without stenosis. The left vertebral appears mildly dominant and is then patent to the skull base without additional plaque. CTA HEAD Posterior circulation: Dominant left V4 segment. Mild to moderate left V4 segment plaque without significant stenosis. Patent PICA origins. No right V4 stenosis. Patent vertebrobasilar junction. Patent basilar artery without stenosis. Patent AICA, SCA and PCA  origins. Posterior communicating arteries are diminutive or absent. Bilateral PCA branches are within normal limits. Anterior circulation: Both ICA siphons are patent. On the left there is mild calcified plaque without stenosis. Normal left ophthalmic artery origin. On the right there is moderate calcified plaque with mild to moderate supraclinoid segment stenosis (series 9, image 119). Normal right ophthalmic artery origin. Patent carotid termini. Normal MCA and right ACA origins. There is a moderate to severe stenosis at the left ACA origin (series 13, image 19). Anterior communicating artery and bilateral ACA branches are within normal limits. Right MCA M1 segment and bifurcation are patent without stenosis. Right MCA branches are within normal limits. Left MCA M1 segment and bifurcation are patent without stenosis. There is mild irregularity of the dominant left M2 branch on series 14, image 29 without significant stenosis. Venous sinuses: Patent, prominent left arachnoid granulation at the junction of the transverse and sigmoid sinuses (normal variant). Anatomic variants: Dominant left vertebral artery. Review of the MIP images confirms the above findings IMPRESSION: 1. Negative for large vessel occlusion. 2. Positive for bulky soft plaque at the Right ICA bulb with high-grade stenosis numerically estimated at 70%. Superimposed mild to moderate distal right ICA supraclinoid stenosis due to calcified plaque. 3. Moderate to severe stenosis at the Left ACA origin. 4. No other hemodynamically significant large vessel stenosis. 5. Left basal ganglia lacunar infarct with no hemorrhage or mass effect may be acute to subacute. 6. No new intracranial abnormality. 7. Bilateral paranasal sinusitis. 8. Aortic Atherosclerosis (ICD10-I70.0). Electronically Signed   By: Odessa FlemingH  Hall M.D.   On: 07/06/2019 17:00     ASSESSMENT AND PLAN  69 y.o. male with past medical history significant for hypertension presents to the  emergency department with slurred speech that began 3 days ago.  CTA shows 70% stenosis of the right ICA, given slurred speech and possible left facial droop may be symptomatic carotid stenosis requiring CEA.  Suspected acute Ischemic Stroke Right ICA stenosis, possibly symptomatic: MRI pending  Recommend # MRI of the brain without contrast: Still pending, if shows right-sided infarcts recommend vascular surgery consult #Transthoracic Echo  # Start patient on ASA 325mg  daily #Start or continue Atorvastatin 80 mg/other high intensity statin # BP goal: permissive HTN upto 220/120 mmHg ( 185/110 if patient has CHF, CKD) # HBAIC and Lipid profile # Telemetry monitoring # Frequent neuro checks #  stroke swallow screen  Please page stroke NP  Or  PA  Or MD from 8am -4 pm  as this patient from this time will be  followed by the stroke.   You can look them up on [email or web address]  Password [facility] [email or web address]    Annalise Mcdiarmid [facility] Pager Number [phone]

## 2019-07-07 NOTE — Progress Notes (Signed)
Initial Nutrition Assessment  DOCUMENTATION CODES:   Not applicable  INTERVENTION:  Glucerna Shake po TID, each supplement provides 220 kcal and 10 grams of protein  MVI daily   NUTRITION DIAGNOSIS:   Altered nutrition lab value related to other (see comment)(newly diagnosed DM) as evidenced by other (comment)(CBG 240).   GOAL:   Patient will meet greater than or equal to 90% of their needs    MONITOR:   PO intake, Supplement acceptance, Labs, Weight trends  REASON FOR ASSESSMENT:   Malnutrition Screening Tool    ASSESSMENT:   Pt with a PMH significant for HTN admitted with acute CVA. Pt with newly diagnosed DM.  Pt reports appetite is fine. At home, he often skips breakfast, then has a sandwich or frozen meal for lunch. For dinner, he often eats meat, potatoes, green beans, and corn. Pt reports drinking Ensure at home, about 1/day. Pt denies wt loss PTA.   PO Intake: 85% x 1 recorded meal   Medications reviewed and include: SSI Labs reviewed: CBG 240   NUTRITION - FOCUSED PHYSICAL EXAM:    Most Recent Value  Orbital Region  No depletion  Upper Arm Region  No depletion  Thoracic and Lumbar Region  No depletion  Buccal Region  No depletion  Temple Region  No depletion  Clavicle Bone Region  No depletion  Clavicle and Acromion Bone Region  No depletion  Scapular Bone Region  No depletion  Dorsal Hand  No depletion  Patellar Region  No depletion  Anterior Thigh Region  No depletion  Posterior Calf Region  No depletion  Edema (RD Assessment)  None  Hair  Reviewed  Eyes  Reviewed  Mouth  Reviewed  Skin  Reviewed  Nails  Reviewed       Diet Order:   Diet Order            Diet heart healthy/carb modified Room service appropriate? Yes; Fluid consistency: Thin  Diet effective now              EDUCATION NEEDS:   Not appropriate for education at this time  Skin:  Skin Assessment: Reviewed RN Assessment  Last BM:  07/05/19  Height:   Ht  Readings from Last 1 Encounters:  07/06/19 6\' 2"  (1.88 m)    Weight:   Wt Readings from Last 1 Encounters:  07/06/19 97.5 kg    BMI:  Body mass index is 27.6 kg/m.  Estimated Nutritional Needs:   Kcal:  2300-2500  Protein:  115-130 grams  Fluid:  >2L/d    07/08/19, MS, RD, LDN RD pager number and weekend/on-call pager number located in Amion.

## 2019-07-07 NOTE — Evaluation (Signed)
Physical Therapy Evaluation Patient Details Name: Samuel Bradshaw MRN: [id number] DOB: 03-06-1951 Today's Date: 07/07/2019   History of Present Illness  Pt is a 69 yo male presenting with slurred speech on 3/22, with some facial droop. 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 The pt now presents with minor limitations in functional mobility and stability due to above dx, but is safe to return home with continued supervision from family. During today's session, the pt was able to demo good stability with all transfers and ambulation without AD, and stated that his mobility feels like his baseline. The pt scored a 23/24 (<19 indicates increased risk of falls) on the Dynamic Gait Index, indicating he is able to withstand challenges while maintaining good stability. The pt has no acute PT needs, so we will sign off at  This time.      Follow Up Recommendations No PT follow up;Supervision/Assistance - 24 hour    Equipment Recommendations  None recommended by PT    Recommendations for Other Services       Precautions / Restrictions Precautions Precautions: None Restrictions Weight Bearing Restrictions: No      Mobility  Bed Mobility Overal bed mobility: Modified Independent                Transfers Overall transfer level: Modified independent Equipment used: None                Ambulation/Gait Ambulation/Gait assistance: Modified independent (Device/Increase time) Gait Distance (Feet): 250 Feet Assistive device: None   Gait velocity: 1.3 m/s Gait velocity interpretation: >4.37 ft/sec, indicative of normal walking speed General Gait Details: pt able to ambulate around unit  with multiple additional balance challenges without LOB or evidence of instability  Stairs            Wheelchair Mobility    Modified Rankin (Stroke Patients Only) Modified Rankin (Stroke Patients Only) Pre-Morbid Rankin Score: No symptoms Modified Rankin: Slight disability     Balance Overall balance assessment: Modified Independent                               Standardized Balance Assessment Standardized Balance Assessment : Dynamic Gait Index   Dynamic Gait Index Level Surface: Normal Change in Gait Speed: Normal Gait with Horizontal Head Turns: Normal Gait with Vertical Head Turns: Normal Gait and Pivot Turn: Normal Step Over Obstacle: Normal Step Around Obstacles: Normal Steps: Mild Impairment Total Score: 23       Pertinent Vitals/Pain Pain Assessment: No/denies pain    Home Living Family/patient expects to be discharged to:: Private residence Living Arrangements: Spouse/significant other Available Help at Discharge: Family Type of Home: House Home Access: Stairs to enter Entrance Stairs-Rails: None Entrance Stairs-Number of Steps: 3 Home Layout: Two level;Able to live on main level with bedroom/bathroom;Full bath on main level Home Equipment: Shower seat - built in;Grab bars - tub/shower Additional Comments: works Systems developer- works on a farm, drives    Prior Function Level of Independence: Independent      ADL's / Homemaking Assistance Needed: normally sits to Journalist, newspaper  Dominant Hand: Right    Extremity/Trunk Assessment   Upper Extremity Assessment Upper Extremity Assessment: Overall WFL for tasks assessed    Lower Extremity Assessment Lower Extremity Assessment: Overall WFL for tasks assessed    Cervical / Trunk Assessment Cervical / Trunk Assessment: Normal  Communication   Communication: Expressive difficulties  Cognition Arousal/Alertness: Awake/alert Behavior During Therapy: WFL for tasks  assessed/performed Overall Cognitive Status: Within Functional Limits for tasks assessed                                        General Comments General comments (skin integrity, edema, etc.): pt educated on signs and symptoms of a stroke with booklet. pt states "yeah i have to do that" pt also demonstrates a "leap" when asked to step over blue square on unit showing higher level of balance.    Exercises     Assessment/Plan    PT Assessment Patent does not need any further PT services  PT Problem List         PT Treatment Interventions      PT Goals (Current goals can be found in the Care Plan section)  Acute Rehab PT Goals Patient Stated Goal: to return home today PT Goal Formulation: With patient Time For Goal Achievement: 07/21/19 Potential to Achieve Goals: Good    Frequency     Barriers to discharge        Co-evaluation               AM-PAC PT "6 Clicks" Mobility  Outcome Measure Help needed turning from your back to your side while in a flat bed without using bedrails?: None Help needed moving from lying on your back to sitting on the side of a flat bed without using bedrails?: None Help needed moving to and from a bed to a chair (including a wheelchair)?: None Help needed standing up from a chair using your arms (e.g., wheelchair or bedside chair)?: None Help needed to walk in hospital room?: None Help needed climbing 3-5 steps with a railing? : None 6 Click Score: 24    End of Session Equipment Utilized During Treatment: Gait belt Activity Tolerance: Patient tolerated treatment well Patient left: in chair;with call bell/phone within reach Nurse Communication: Mobility status PT Visit Diagnosis: Difficulty in walking, not elsewhere classified (R26.2);Unsteadiness on feet (R26.81)    Time: 9937-1696 PT Time Calculation (min) (ACUTE ONLY): 20 min   Charges:   PT Evaluation $PT Eval Moderate Complexity: 1 Mod          Karma Ganja,  PT, DPT   Acute Rehabilitation Department Pager #: [phone]  Otho Bellows 07/07/2019, 1:08 PM

## 2019-07-07 NOTE — Plan of Care (Cosign Needed)
Plan of Care  The patient saw Dr. Laurence Slate for neurology consult at approximately 5 AM today. Stroke work up is underway. The Stroke Team will not see pt today but will follow up tomorrow. Please call with questions or concerns.   Delton See PA-C [facility] Pager [phone] 07/07/2019, 2:10 PM

## 2019-07-07 NOTE — Consult Note (Addendum)
REASON FOR CONSULT:    Right carotid stenosis.  The consult was requested by Dr. Unk Pinto.   ASSESSMENT & PLAN:   RIGHT CAROTID STENOSIS: By CT angiogram of the neck the patient appears to have an approximately 70% right ICA stenosis.  By duplex the velocities at [facility] were elevated on the right suggesting a 60 to 79% stenosis in the lower end of that range.  He is right-handed and is presenting symptom was expressive aphasia.  His MRI and CT of the head suggest a left brain stroke.  Thus I am not sure that the right carotid stenosis is symptomatic.  He did reportedly have some left facial drooping.  I have ordered a repeat carotid duplex to further assess the severity of the stenosis on the right given that by duplex and [facility] the end diastolic velocities were not especially elevated.  He was not on aspirin or statin prior to his admission and is on aspirin and a statin now.  We have discussed tobacco cessation and he seems motivated to try to quit.  In addition he is very active.  I will follow-up after his carotid duplex scan and also welcome neurology's input.  Currently I do not think there is a need for urgent right carotid endarterectomy.  ADDENDUM: I reviewed his carotid duplex scan that was done today.  He has a 60 to 79% right carotid stenosis with no significant stenosis on the left.  I think the stenosis on the right is asymptomatic and I will simply arrange a follow-up duplex scan in 6 months for this.  From my standpoint he can be discharged.   Waverly Ferrari, MD Office: [phone]   HPI:   Samuel Bradshaw is a pleasant 69 y.o. 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  He reportedly had some slurred speech 5 days ago (Monday).  This persisted although he kept working on the farm Monday Tuesday and Wednesday. He tells me that he has some right facial drooping although the family says that he had facial drooping on the left side.  However as the symptoms were not  improving he presented to the [facility] emergency department yesterday.  His CT of the head there showed a possible stroke and an MRI was recommended.  However the MRI was broken.  The CTA of the neck also showed an ICA stenosis and a right ICA supraclinoid stenosis.  After discussion with neurology was felt that he should be transferred to [facility] where he could undergo MRI.  Patient is right-handed.  He denies any previous history of stroke, TIAs, expressive or receptive aphasia or amaurosis fugax.  Prior to this event, he was not on aspirin and was not on a statin.  Both of these have been started since his admission.  His risk factors for peripheral vascular disease include diabetes, hypertension, hypercholesterolemia, and tobacco use.  He smokes 1 to 1-1/2 packs/day and has been smoking for 25 years.  He denies any family history of premature cardiovascular disease.  History reviewed. No pertinent past medical history.  History reviewed. No pertinent family history.  SOCIAL HISTORY: Social History   Socioeconomic History  . Marital status: Married    Spouse name: Not on file  . Number of children: Not on file  . Years of education: Not on file  . Highest education level: Not on file  Occupational History  . Not on file  Tobacco Use  . Smoking status: Current Every  Day Smoker    Packs/day: 1.00    Years: 25.00    Pack years: 25.00    Types: Cigarettes  . Smokeless tobacco: Never Used  Substance and Sexual Activity  . Alcohol use: Yes    Alcohol/week: 6.0 standard drinks    Types: 6 Cans of beer per week    Comment: 6 beers a week  . Drug use: Not on file  . Sexual activity: Not on file  Other Topics Concern  . Not on file  Social History Narrative  . Not on file   Social Determinants of Health   Financial Resource Strain:   . Difficulty of Paying Living Expenses:   Food Insecurity:   . Worried About Programme researcher, broadcasting/film/video in the Last Year:   . Barista  in the Last Year:   Transportation Needs:   . Freight forwarder (Medical):   Marland Kitchen Lack of Transportation (Non-Medical):   Physical Activity:   . Days of Exercise per Week:   . Minutes of Exercise per Session:   Stress:   . Feeling of Stress :   Social Connections:   . Frequency of Communication with Friends and Family:   . Frequency of Social Gatherings with Friends and Family:   . Attends Religious Services:   . Active Member of Clubs or Organizations:   . Attends Banker Meetings:   Marland Kitchen Marital Status:   Intimate Partner Violence:   . Fear of Current or Ex-Partner:   . Emotionally Abused:   Marland Kitchen Physically Abused:   . Sexually Abused:     Allergies  Allergen Reactions  . Bee Venom     Current Facility-Administered Medications  Medication Dose Route Frequency Provider Last Rate Last Admin  . acetaminophen (TYLENOL) tablet 650 mg  650 mg Oral Q4H PRN Emokpae, Ejiroghene E, MD       Or  . acetaminophen (TYLENOL) 160 MG/5ML solution 650 mg  650 mg Per Tube Q4H PRN Emokpae, Ejiroghene E, MD       Or  . acetaminophen (TYLENOL) suppository 650 mg  650 mg Rectal Q4H PRN Emokpae, Ejiroghene E, MD      . amLODipine (NORVASC) tablet 5 mg  5 mg Oral Daily Emokpae, Ejiroghene E, MD   5 mg at 07/07/19 1049  . [START ON 07/08/2019] aspirin EC tablet 325 mg  325 mg Oral Daily Kathlen Mody, MD      . atorvastatin (LIPITOR) tablet 80 mg  80 mg Oral q1800 Kathlen Mody, MD      . feeding supplement (GLUCERNA SHAKE) (GLUCERNA SHAKE) liquid 237 mL  237 mL Oral TID BM Kathlen Mody, MD      . heparin injection 5,000 Units  5,000 Units Subcutaneous Q8H Emokpae, Ejiroghene E, MD   5,000 Units at 07/07/19 0526  . hydrALAZINE (APRESOLINE) injection 10 mg  10 mg Intravenous Q4H PRN Emokpae, Ejiroghene E, MD      . insulin aspart (novoLOG) injection 0-9 Units  0-9 Units Subcutaneous TID WC Emokpae, Ejiroghene E, MD   3 Units at 07/07/19 1225  . insulin glargine (LANTUS) injection 8 Units  8  Units Subcutaneous QHS Kathlen Mody, MD      . multivitamin with minerals tablet 1 tablet  1 tablet Oral Daily Kathlen Mody, MD      . senna-docusate (Senokot-S) tablet 1 tablet  1 tablet Oral QHS PRN Emokpae, Ejiroghene E, MD        REVIEW OF SYSTEMS:  [X]   denotes positive finding, [ ]  denotes negative finding Cardiac  Comments:  Chest pain or chest pressure:    Shortness of breath upon exertion:    Short of breath when lying flat:    Irregular heart rhythm:        Vascular    Pain in calf, thigh, or hip brought on by ambulation:    Pain in feet at night that wakes you up from your sleep:     Blood clot in your veins:    Leg swelling:         Pulmonary    Oxygen at home:    Productive cough:     Wheezing:         Neurologic    Sudden weakness in arms or legs:     Sudden numbness in arms or legs:     Sudden onset of difficulty speaking or slurred speech: x   Temporary loss of vision in one eye:     Problems with dizziness:         Gastrointestinal    Blood in stool:     Vomited blood:         Genitourinary    Burning when urinating:     Blood in urine:        Psychiatric    Major depression:         Hematologic    Bleeding problems:    Problems with blood clotting too easily:        Skin    Rashes or ulcers:        Constitutional    Fever or chills:     PHYSICAL EXAM:   Vitals:   07/07/19 0756 07/07/19 1040 07/07/19 1138 07/07/19 1303  BP: (!) 172/100 (!) 195/107 (!) 184/104   Pulse: 67 73 72 72  Resp: 18 18 18    Temp: 97.8 F (36.6 C)  98.9 F (37.2 C)   TempSrc: Oral  Oral   SpO2: 98% 99% 97% 99%  Weight:      Height:        GENERAL: The patient is a well-nourished male, in no acute distress. The vital signs are documented above. CARDIAC: There is a regular rate and rhythm.  VASCULAR: I do not detect carotid bruits. He has palpable radial pulses bilaterally. He has palpable femoral, popliteal, and posterior tibial pulses  bilaterally. PULMONARY: There is good air exchange bilaterally without wheezing or rales. ABDOMEN: Soft and non-tender with normal pitched bowel sounds.  I do not palpate an abdominal aortic aneurysm. MUSCULOSKELETAL: There are no major deformities or cyanosis. NEUROLOGIC: No focal weakness or paresthesias are detected. SKIN: There are no ulcers or rashes noted. PSYCHIATRIC: The patient has a normal affect.  DATA:    MRI BRAIN: I reviewed the MRI of the head that was done today.  This showed a small area of restricted diffusion in the left basal ganglia involving the body of the caudate extending to the posterior lentiform nucleus consistent with acute or subacute infarct.  CT HEAD: I reviewed the CT of the head that was done yesterday which suggested acute or subacute left periventricular white matter infarct.  CT ANGIOGRAM NECK: I reviewed the images of his CT angiogram of the neck that was done yesterday.  This showed a plaque in the right internal carotid artery bulb with an estimated 70% stenosis.  By my review of the films this is a fairly focal stenosis about 1 cm beyond the carotid bifurcation which is  fairly smooth.  In addition there was a mild to moderate distal right ICA supraclinoid stenosis with calcific plaque.  There was a moderate to severe stenosis at the left ACA origin.  CAROTID DUPLEX: I reviewed the carotid duplex scan that was done at [facility].  On the right side there was a suggestion of a greater than 70% stenosis.  Peak systolic velocity was 088 cm/s with an end-diastolic velocity of 59 cm/s.  There was no significant stenosis on the left.  LABS: His GFR is greater than 60.  Creatinine is 0.68.

## 2019-07-07 NOTE — TOC Initial Note (Signed)
Transition of Care [facility]) - Initial/Assessment Note    Patient Details  Name: Samuel Bradshaw MRN: [id number] Date of Birth: [birth year]  [facility]) CM/SW Contact:    Kermit Balo, RN Phone Number: 07/07/2019, 3:31 PM  Clinical Narrative:                 Pt is from home with his spouse. No f/u per PT/OT and no DME needs.  PCP: Dr Catalina Pizza and has appt on April 6th.  TOC following for further d/c needs. Wife able to provide supervision at home and transport to home.  Expected Discharge Plan: Home/Self Care Barriers to Discharge: Continued Medical Work up   Patient Goals and CMS Choice        Expected Discharge Plan and Services Expected Discharge Plan: Home/Self Care   Discharge Planning Services: CM Consult   Living arrangements for the past 2 months: Single Family Home                                      Prior Living Arrangements/Services Living arrangements for the past 2 months: Single Family Home Lives with:: Spouse Patient language and need for interpreter reviewed:: Yes Do you feel safe going back to the place where you live?: Yes      Need for Family Participation in Patient Care: Yes (Comment) Care giver support system in place?: Yes (comment) Current home services: DME(walker but doesnt use) Criminal Activity/Legal Involvement Pertinent to Current Situation/Hospitalization: No - Comment as needed  Activities of Daily Living Home Assistive Devices/Equipment: None ADL Screening (condition at time of admission) Patient's cognitive ability adequate to safely complete daily activities?: Yes Is the patient deaf or have difficulty hearing?: No Does the patient have difficulty seeing, even when wearing glasses/contacts?: No Does the patient have difficulty concentrating, remembering, or making decisions?: No Patient able to express need for assistance with ADLs?: Yes Does the patient have difficulty dressing or bathing?: No Independently  performs ADLs?: Yes (appropriate for developmental age) Does the patient have difficulty walking or climbing stairs?: No Weakness of Legs: None Weakness of Arms/Hands: None  Permission Sought/Granted                  Emotional Assessment Appearance:: Appears stated age Attitude/Demeanor/Rapport: Engaged Affect (typically observed): Accepting Orientation: : Oriented to Self, Oriented to Place, Oriented to  Time, Oriented to Situation   Psych Involvement: No (comment)  Admission diagnosis:  Expressive aphasia [R47.01] Acute CVA (cerebrovascular accident) [facility]) [I63.9] Cerebrovascular accident (CVA), unspecified mechanism (HCC) [I63.9] Patient Active Problem List   Diagnosis Date Noted  . Acute CVA (cerebrovascular accident) (HCC) 07/06/2019  . Essential hypertension 07/06/2019  . Dyslipidemia 07/06/2019  . Diabetes mellitus (HCC) 07/06/2019  . Tobacco use 07/06/2019   PCP:  Patient, No Pcp Per Pharmacy:   [facility] [phone] - [place], [place] - [address] AT [facility] OF [address] [place] [address] Phone: [phone] Fax: [phone]     Social Determinants of Health (SDOH) Interventions    Readmission Risk Interventions No flowsheet data found.

## 2019-07-07 NOTE — Progress Notes (Signed)
PROGRESS NOTE    Samuel Bradshaw  KCL:[id number] DOB: 1950-07-12 DOA: 07/06/2019 PCP: Patient, No Pcp Per      Brief Narrative:   69 year old gentleman prior history significant for hypertension and diabetes not on any medication presents to ED for slurred speech and left facial droop. He was admitted for evaluation of stroke.  Neurology consulted and is on board. Assessment & Plan:   Principal Problem:   Acute CVA (cerebrovascular accident) [facility]) Active Problems:   Essential hypertension   Dyslipidemia   Diabetes mellitus ([place])   Tobacco use   Acute left basal ganglia infarct:  Admitted for stroke evaluation.  MRI brain without contrast shows Small area of restricted diffusion in the left basal ganglia, involving the body of the caudate extending to the posterior lentiform nucleus.  Scattered foci of T2 hyperintensity within the white matter of the cerebral hemispheres and pons, nonspecific, but most likely related to chronic small vessel ischemia. CT angio of the head and neck shows Positive for bulky soft plaque at the Right ICA bulb with high-grade stenosis numerically estimated at 70%. Superimposed mild to moderate distal right ICA supraclinoid stenosis due to calcified plaque. Moderate to severe stenosis at the Left ACA origin.  Echocardiogram ordered for further evaluation.  Vascular surgery consulted for evaluation of the right ICA stenosis.  LDL is 141 and A1c is 9.  Started the patient on aspirin 81 mg daily and atorvastatin 80 mg daily.    Essential hypertension:  Suboptimally controlled.  Permissive hypertension till 220/110 mmhg.    Uncontrolled DM: CBG (last 3)  Recent Labs    07/07/19 1143  GLUCAP 240*   A1C IS 9. WILL start the patient on 8 units of Lantus.  DM co ordinator consult.    Hyperlipidemia:   Started the patient on atorvastatin 80 mg daily.    DVT prophylaxis: Heparin  Code Status: full code.  Family Communication: none at bedside.    Disposition Plan:  . Patient came from: Home             . Anticipated d/c place: pending therapy evaluation . Barriers to d/c OR conditions which need to be met to effect a safe d/c:pending further work up for stroke   Consultants:  Neurology.   Procedures:   MRI brain without contrast.   CTA of the head and neck.   Carotid duplex  Echocardiogram.    Antimicrobials: NOne.    Subjective:  reports he is back to baseline.   Objective: Vitals:   07/07/19 0337 07/07/19 0756 07/07/19 1040 07/07/19 1138  BP: (!) 180/106 (!) 172/100 (!) 195/107 (!) 184/104  Pulse: 67 67 73 72  Resp: 18 18 18 18   Temp: (!) 97.5 F (36.4 C) 97.8 F (36.6 C)  98.9 F (37.2 C)  TempSrc: Oral Oral  Oral  SpO2: 98% 98% 99% 97%  Weight:      Height:        Intake/Output Summary (Last 24 hours) at 07/07/2019 1144 Last data filed at 07/07/2019 0800 Gross per 24 hour  Intake 240 ml  Output --  Net 240 ml   Filed Weights   07/06/19 1030  Weight: 97.5 kg    Examination:  General exam: Appears calm and comfortable  Respiratory system: Clear to auscultation. Respiratory effort normal. Cardiovascular system: S1 & S2 heard, RRR. No JVD, . No pedal edema. Gastrointestinal system: Abdomen is nondistended, soft and nontender.  Normal bowel sounds heard. Central nervous system: Alert and oriented. No  focal neurological deficits. Extremities: Symmetric 5 x 5 power. Skin: No rashes, lesions or ulcers Psychiatry:  Mood & affect appropriate.     Data Reviewed: I have personally reviewed following labs and imaging studies  CBC: Recent Labs  Lab 07/06/19 1200  WBC 8.0  NEUTROABS 4.9  HGB 16.1  HCT 47.5  MCV 94.1  PLT 174   Basic Metabolic Panel: Recent Labs  Lab 07/06/19 1200  NA 138  K 3.7  CL 102  CO2 28  GLUCOSE 189*  BUN 13  CREATININE 0.68  CALCIUM 8.9   GFR: Estimated Creatinine Clearance: 104.2 mL/min (by C-G formula based on SCr of 0.68 mg/dL). Liver Function  Tests: Recent Labs  Lab 07/06/19 1200  AST 19  ALT 24  ALKPHOS 88  BILITOT 0.6  PROT 7.2  ALBUMIN 3.9   No results for input(s): LIPASE, AMYLASE in the last 168 hours. No results for input(s): AMMONIA in the last 168 hours. Coagulation Profile: No results for input(s): INR, PROTIME in the last 168 hours. Cardiac Enzymes: No results for input(s): CKTOTAL, CKMB, CKMBINDEX, TROPONINI in the last 168 hours. BNP (last 3 results) No results for input(s): PROBNP in the last 8760 hours. HbA1C: Recent Labs    07/06/19 1200  HGBA1C 9.0*   CBG: No results for input(s): GLUCAP in the last 168 hours. Lipid Profile: Recent Labs    07/06/19 1200  CHOL 203*  HDL 43  LDLCALC 141*  TRIG 97  CHOLHDL 4.7   Thyroid Function Tests: No results for input(s): TSH, T4TOTAL, FREET4, T3FREE, THYROIDAB in the last 72 hours. Anemia Panel: No results for input(s): VITAMINB12, FOLATE, FERRITIN, TIBC, IRON, RETICCTPCT in the last 72 hours. Sepsis Labs: No results for input(s): PROCALCITON, LATICACIDVEN in the last 168 hours.  No results found for this or any previous visit (from the past 240 hour(s)).       Radiology Studies: CT Angio Head W or Wo Contrast  Result Date: 07/06/2019 CLINICAL DATA:  69 year old male with slurred speech upon waking today. EXAM: CT ANGIOGRAPHY HEAD AND NECK TECHNIQUE: Multidetector CT imaging of the head and neck was performed using the standard protocol during bolus administration of intravenous contrast. Multiplanar CT image reconstructions and MIPs were obtained to evaluate the vascular anatomy. Carotid stenosis measurements (when applicable) are obtained utilizing NASCET criteria, using the distal internal carotid diameter as the denominator. CONTRAST:  29m OMNIPAQUE IOHEXOL 350 MG/ML SOLN COMPARISON:  Plain head CT 1219 hours today. FINDINGS: CT HEAD Brain: Hypodensity in the left basal ganglia appears somewhat increased in conspicuity (series 4, image 17). No  associated hemorrhage or mass effect. Mild for age cage in a white matter hypodensity elsewhere is stable. No midline shift, ventriculomegaly, mass effect, evidence of mass lesion, intracranial hemorrhage or evidence of cortically based acute infarction. No suspicious intracranial vascular hyperdensity. Calvarium and skull base: No acute osseous abnormality identified. Paranasal sinuses: Stable moderate to severe ethmoid and sphenoid sinus disease with mucosal thickening and opacification. Maxillary mucoperiosteal thickening. Tympanic cavities and mastoids remain well pneumatized. Orbits: No acute orbit or scalp soft tissue finding. CTA NECK Skeleton: Scattered dental caries. Lower cervical spine degeneration. No acute osseous abnormality identified. Upper chest: Negative. Other neck: Subcentimeter bilateral thyroid nodules do not meet consensus criteria for ultrasound follow-up. (Ref: J Am Coll Radiol. 2015 Feb;12(2): 143-50).Other neck soft tissues are within normal limits. Aortic arch: Calcified aortic atherosclerosis. Three vessel arch configuration. Right carotid system: Brachiocephalic artery and right CCA origin plaque without stenosis. Only mild  plaque at the right ICA origin but bulky circumferential soft plaque at the right ICA bulb resulting in stenosis numerically estimated at 70 % with respect to the distal vessel. See series 10, image 136 and series 9, image 222. Beyond this level the right ICA is patent with a normal caliber to the skull base. Left carotid system: Left CCA origin plaque without stenosis. Soft plaque in the medial left CCA proximal to the bifurcation without stenosis. Mild soft and calcified plaque in the proximal left ICA without stenosis to the skull base. Vertebral arteries: Minimal plaque in the proximal right subclavian artery without stenosis. Normal right vertebral artery origin. Patent right vertebral artery to the skull base without stenosis. Bulky soft plaque in the proximal  left subclavian artery (series 11, image 128) with 50 % stenosis with respect to the distal vessel. Mild soft plaque at the left vertebral artery origin without stenosis. The left vertebral appears mildly dominant and is then patent to the skull base without additional plaque. CTA HEAD Posterior circulation: Dominant left V4 segment. Mild to moderate left V4 segment plaque without significant stenosis. Patent PICA origins. No right V4 stenosis. Patent vertebrobasilar junction. Patent basilar artery without stenosis. Patent AICA, SCA and PCA origins. Posterior communicating arteries are diminutive or absent. Bilateral PCA branches are within normal limits. Anterior circulation: Both ICA siphons are patent. On the left there is mild calcified plaque without stenosis. Normal left ophthalmic artery origin. On the right there is moderate calcified plaque with mild to moderate supraclinoid segment stenosis (series 9, image 119). Normal right ophthalmic artery origin. Patent carotid termini. Normal MCA and right ACA origins. There is a moderate to severe stenosis at the left ACA origin (series 13, image 19). Anterior communicating artery and bilateral ACA branches are within normal limits. Right MCA M1 segment and bifurcation are patent without stenosis. Right MCA branches are within normal limits. Left MCA M1 segment and bifurcation are patent without stenosis. There is mild irregularity of the dominant left M2 branch on series 14, image 29 without significant stenosis. Venous sinuses: Patent, prominent left arachnoid granulation at the junction of the transverse and sigmoid sinuses (normal variant). Anatomic variants: Dominant left vertebral artery. Review of the MIP images confirms the above findings IMPRESSION: 1. Negative for large vessel occlusion. 2. Positive for bulky soft plaque at the Right ICA bulb with high-grade stenosis numerically estimated at 70%. Superimposed mild to moderate distal right ICA supraclinoid  stenosis due to calcified plaque. 3. Moderate to severe stenosis at the Left ACA origin. 4. No other hemodynamically significant large vessel stenosis. 5. Left basal ganglia lacunar infarct with no hemorrhage or mass effect may be acute to subacute. 6. No new intracranial abnormality. 7. Bilateral paranasal sinusitis. 8. Aortic Atherosclerosis (ICD10-I70.0). Electronically Signed   By: Genevie Ann M.D.   On: 07/06/2019 17:00   CT Head Wo Contrast  Result Date: 07/06/2019 CLINICAL DATA:  Woke up Monday morning with slurred speech. Still slightly slurred speech. EXAM: CT HEAD WITHOUT CONTRAST TECHNIQUE: Contiguous axial images were obtained from the base of the skull through the vertex without intravenous contrast. COMPARISON:  None. FINDINGS: Brain: The ventricles are in the midline without mass effect or shift. No extra-axial fluid collections identified. Patchy white matter changes, likely microvascular ischemic change. There is a slightly rounded area of low attenuation in the deep white periventricular white matter suspicious for a acute or subacute infarct. MR imaging is suggested for further evaluation. No hemispheric infarction or intracranial hemorrhage. The  brainstem and cerebellum appear normal. Vascular: Scattered vascular calcifications. No definite hyperdense vessels or aneurysm. Skull: No skull fracture or bone lesions. Sinuses/Orbits: Near complete opacification of the sphenoid sinus and fairly marked mucoperiosteal thickening involving the ethmoid air cells and maxillary sinuses. Scattered mastoid effusions are noted on the left side. Other: No scalp lesions or hematoma. IMPRESSION: 1. Suspect acute or subacute left periventricular white matter infarct. Recommend MR imaging for further evaluation. 2. No findings for hemispheric infarction or intracranial hemorrhage. 3. Paranasal sinus disease and left-sided mastoid effusions. Electronically Signed   By: Marijo Sanes M.D.   On: 07/06/2019 12:31   CT  Angio Neck W and/or Wo Contrast  Result Date: 07/06/2019 CLINICAL DATA:  69 year old male with slurred speech upon waking today. EXAM: CT ANGIOGRAPHY HEAD AND NECK TECHNIQUE: Multidetector CT imaging of the head and neck was performed using the standard protocol during bolus administration of intravenous contrast. Multiplanar CT image reconstructions and MIPs were obtained to evaluate the vascular anatomy. Carotid stenosis measurements (when applicable) are obtained utilizing NASCET criteria, using the distal internal carotid diameter as the denominator. CONTRAST:  76m OMNIPAQUE IOHEXOL 350 MG/ML SOLN COMPARISON:  Plain head CT 1219 hours today. FINDINGS: CT HEAD Brain: Hypodensity in the left basal ganglia appears somewhat increased in conspicuity (series 4, image 17). No associated hemorrhage or mass effect. Mild for age cage in a white matter hypodensity elsewhere is stable. No midline shift, ventriculomegaly, mass effect, evidence of mass lesion, intracranial hemorrhage or evidence of cortically based acute infarction. No suspicious intracranial vascular hyperdensity. Calvarium and skull base: No acute osseous abnormality identified. Paranasal sinuses: Stable moderate to severe ethmoid and sphenoid sinus disease with mucosal thickening and opacification. Maxillary mucoperiosteal thickening. Tympanic cavities and mastoids remain well pneumatized. Orbits: No acute orbit or scalp soft tissue finding. CTA NECK Skeleton: Scattered dental caries. Lower cervical spine degeneration. No acute osseous abnormality identified. Upper chest: Negative. Other neck: Subcentimeter bilateral thyroid nodules do not meet consensus criteria for ultrasound follow-up. (Ref: J Am Coll Radiol. 2015 Feb;12(2): 143-50).Other neck soft tissues are within normal limits. Aortic arch: Calcified aortic atherosclerosis. Three vessel arch configuration. Right carotid system: Brachiocephalic artery and right CCA origin plaque without stenosis.  Only mild plaque at the right ICA origin but bulky circumferential soft plaque at the right ICA bulb resulting in stenosis numerically estimated at 70 % with respect to the distal vessel. See series 10, image 136 and series 9, image 222. Beyond this level the right ICA is patent with a normal caliber to the skull base. Left carotid system: Left CCA origin plaque without stenosis. Soft plaque in the medial left CCA proximal to the bifurcation without stenosis. Mild soft and calcified plaque in the proximal left ICA without stenosis to the skull base. Vertebral arteries: Minimal plaque in the proximal right subclavian artery without stenosis. Normal right vertebral artery origin. Patent right vertebral artery to the skull base without stenosis. Bulky soft plaque in the proximal left subclavian artery (series 11, image 128) with 50 % stenosis with respect to the distal vessel. Mild soft plaque at the left vertebral artery origin without stenosis. The left vertebral appears mildly dominant and is then patent to the skull base without additional plaque. CTA HEAD Posterior circulation: Dominant left V4 segment. Mild to moderate left V4 segment plaque without significant stenosis. Patent PICA origins. No right V4 stenosis. Patent vertebrobasilar junction. Patent basilar artery without stenosis. Patent AICA, SCA and PCA origins. Posterior communicating arteries are diminutive or absent.  Bilateral PCA branches are within normal limits. Anterior circulation: Both ICA siphons are patent. On the left there is mild calcified plaque without stenosis. Normal left ophthalmic artery origin. On the right there is moderate calcified plaque with mild to moderate supraclinoid segment stenosis (series 9, image 119). Normal right ophthalmic artery origin. Patent carotid termini. Normal MCA and right ACA origins. There is a moderate to severe stenosis at the left ACA origin (series 13, image 19). Anterior communicating artery and bilateral  ACA branches are within normal limits. Right MCA M1 segment and bifurcation are patent without stenosis. Right MCA branches are within normal limits. Left MCA M1 segment and bifurcation are patent without stenosis. There is mild irregularity of the dominant left M2 branch on series 14, image 29 without significant stenosis. Venous sinuses: Patent, prominent left arachnoid granulation at the junction of the transverse and sigmoid sinuses (normal variant). Anatomic variants: Dominant left vertebral artery. Review of the MIP images confirms the above findings IMPRESSION: 1. Negative for large vessel occlusion. 2. Positive for bulky soft plaque at the Right ICA bulb with high-grade stenosis numerically estimated at 70%. Superimposed mild to moderate distal right ICA supraclinoid stenosis due to calcified plaque. 3. Moderate to severe stenosis at the Left ACA origin. 4. No other hemodynamically significant large vessel stenosis. 5. Left basal ganglia lacunar infarct with no hemorrhage or mass effect may be acute to subacute. 6. No new intracranial abnormality. 7. Bilateral paranasal sinusitis. 8. Aortic Atherosclerosis (ICD10-I70.0). Electronically Signed   By: Genevie Ann M.D.   On: 07/06/2019 17:00   MR BRAIN WO CONTRAST  Result Date: 07/07/2019 CLINICAL DATA:  Slurred speech, word-finding difficulty and right facial droop. EXAM: MRI HEAD WITHOUT CONTRAST TECHNIQUE: Multiplanar, multiecho pulse sequences of the brain and surrounding structures were obtained without intravenous contrast. COMPARISON:  Head CT July 04, 2018 FINDINGS: Brain: Small area of restricted diffusion in the left basal ganglia, involving the body of the caudate extending to the posterior lentiform nucleus, corresponding to hypodense area on recent head CT. No other focus of restricted diffusion or hemorrhage. Scattered foci of T2 hyperintensity are seen within the white matter of the cerebral hemispheres and within the pons, nonspecific, most  likely related to chronic small vessel ischemia. Vascular: Normal flow voids. Skull and upper cervical spine: Normal marrow signal. Sinuses/Orbits: Obliteration of the sphenoid sinuses with mucosal thickening and T1 hyperintense/T2 hypointense content, likely inspissated secretion. There is also mucosal thickening of the frontal and maxillary sinuses and ethmoid cells. The orbits are preserved. Other: Bilateral mastoid effusions. IMPRESSION: 1. Small area of restricted diffusion in the left basal ganglia, involving the body of the caudate extending to the posterior lentiform nucleus, corresponding to hypodense area on recent head CT. Findings are consistent with acute to subacute infarct. 2. Scattered foci of T2 hyperintensity within the white matter of the cerebral hemispheres and pons, nonspecific, but most likely related to chronic small vessel ischemia. Electronically Signed   By: Pedro Earls M.D.   On: 07/07/2019 09:31   US Carotid Duplex Bilateral  Result Date: 07/07/2019 CLINICAL DATA:  Visual numbness and speech loss. EXAM: BILATERAL CAROTID DUPLEX ULTRASOUND TECHNIQUE: Pearline Cables scale imaging, color Doppler and duplex ultrasound were performed of bilateral carotid and vertebral arteries in the neck. COMPARISON:  None. FINDINGS: Criteria: Quantification of carotid stenosis is based on velocity parameters that correlate the residual internal carotid diameter with NASCET-based stenosis levels, using the diameter of the distal internal carotid lumen as the denominator for  stenosis measurement. The following velocity measurements were obtained: RIGHT ICA:  266/59 cm/sec CCA:  74/60 cm/sec SYSTOLIC ICA/CCA RATIO:  3.7 ECA:  102 cm/sec LEFT ICA:  81/19 cm/sec CCA:  02/98 cm/sec SYSTOLIC ICA/CCA RATIO:  0.9 ECA:  237 cm/sec RIGHT CAROTID ARTERY: Moderate calcified plaque is present at the level of the carotid bulb and proximal right ICA. Velocity elevation corresponds to an estimated greater than  70% proximal right ICA stenosis. RIGHT VERTEBRAL ARTERY: Antegrade flow with normal waveform and velocity. LEFT CAROTID ARTERY: Moderate partially calcified plaque at the level of the carotid bulb and proximal left ICA. Estimated left ICA stenosis is less than 50%. LEFT VERTEBRAL ARTERY: Antegrade flow with normal waveform and velocity. IMPRESSION: Significant plaque at the level of both carotid bulbs and proximal internal carotid arteries, right greater than left. Estimated proximal right ICA stenosis is greater than 70%. Estimated proximal left ICA stenosis is less than 50%. Electronically Signed   By: Aletta Edouard M.D.   On: 07/07/2019 07:56        Scheduled Meds: . amLODipine  5 mg Oral Daily  . aspirin EC  81 mg Oral Daily  . atorvastatin  40 mg Oral q1800  . heparin  5,000 Units Subcutaneous Q8H  . insulin aspart  0-9 Units Subcutaneous TID WC   Continuous Infusions:   LOS: 1 day       Hosie Poisson, MD [facility]   To contact the attending provider between 7A-7P or the covering provider during after hours 7P-7A, please log into the web site [email or web address] and access using universal [place] password for that web site. If you do not have the password, please call the hospital operator.  07/07/2019, 11:44 AM

## 2019-07-07 NOTE — Progress Notes (Signed)
Carotid artery duplex completed. Refer to "CV Proc" under chart review to view preliminary results.  07/07/2019 4:27 PM Eula Fried., MHA, RVT, RDCS, RDMS

## 2019-07-07 NOTE — Evaluation (Signed)
Occupational Therapy Evaluation Patient Details Name: Samuel Bradshaw MRN: [id number] DOB: [birth year] Today's Date: 07/07/2019    History of Present Illness Pt is a 69 yo male presenting with slurred speech on 3/22, with some facial droop. 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 All education has been completed and the patient has no further questions. See below for any follow-up Occupational Therapy or equipment needs. OT to sign off. Thank you for referral.      Follow Up Recommendations  No OT follow up    Equipment Recommendations  None recommended by OT    Recommendations for Other Services       Precautions / Restrictions Precautions Precautions: None Restrictions Weight Bearing Restrictions: No      Mobility Bed Mobility Overal bed mobility: Modified Independent                Transfers Overall transfer level: Modified independent                    Balance Overall balance assessment: Modified Independent                               Standardized Balance Assessment Standardized Balance Assessment : Dynamic Gait Index   Dynamic Gait Index Level Surface: Normal Change in Gait Speed: Normal Gait with Horizontal Head Turns: Normal Gait with Vertical Head Turns: Normal Gait and Pivot Turn: Normal Step Over Obstacle: Normal Step Around Obstacles: Normal     ADL either performed or assessed with clinical judgement   ADL Overall ADL's : Modified independent                                       General ADL Comments: able to pick up objects off floor, able to complete sock adjustment with figure 4 positioning at EOB. pt able to scan room for objects. pt reaching outside base of suppor on the EOB to get drink off tray pushed > 12  inches from patient     Vision Baseline Vision/History: No visual deficits Vision Assessment?: Yes Eye Alignment: Within Functional Limits Ocular Range of Motion: Within Functional Limits Alignment/Gaze Preference: Within Defined Limits Tracking/Visual Pursuits: Able to track stimulus in all quads without difficulty Saccades: Within functional limits Convergence: Within functional limits     Perception     Praxis      Pertinent Vitals/Pain Pain Assessment: No/denies pain     Hand Dominance Right   Extremity/Trunk Assessment Upper Extremity Assessment Upper Extremity Assessment: Overall WFL for tasks assessed   Lower Extremity Assessment Lower Extremity Assessment: Overall WFL for tasks assessed   Cervical / Trunk Assessment Cervical / Trunk Assessment: Normal   Communication Communication Communication: Expressive difficulties   Cognition Arousal/Alertness: Awake/alert Behavior During Therapy: WFL for tasks assessed/performed Overall Cognitive Status: Within Functional Limits for tasks assessed                                     General Comments  pt educated on signs and symptoms of a stroke with booklet. pt states "yeah i have to do  that" pt also demonstrates a "leap" when asked to step over blue square on unit showing higher level of balance.     Exercises     Shoulder Instructions      Home Living Family/patient expects to be discharged to:: Private residence Living Arrangements: Spouse/significant other Available Help at Discharge: Family Type of Home: House Home Access: Stairs to enter [employer] of Steps: 3 Entrance Stairs-Rails: None Home Layout: Two level;Able to live on main level with bedroom/bathroom;Full bath on main level Alternate Level Stairs-Number of Steps: full flight with railing Alternate Level Stairs-Rails: Right Bathroom Shower/Tub: Occupational psychologist: Standard     Home Equipment: Shower  seat - built in;Grab bars - tub/shower   Additional Comments: works Warehouse manager- works on a farm, drives      Prior Functioning/Environment Level of Independence: Independent    ADL's / Land Needed: normally sits to shower            OT Problem List:        OT Treatment/Interventions:      OT Goals(Current goals can be found in the care plan section) Acute Rehab OT Goals Patient Stated Goal: to return home today  OT Frequency:     Barriers to D/C:            Co-evaluation              AM-PAC OT "6 Clicks" Daily Activity     Outcome Measure Help from another person eating meals?: None Help from another person taking care of personal grooming?: None Help from another person toileting, which includes using toliet, bedpan, or urinal?: None Help from another person bathing (including washing, rinsing, drying)?: None Help from another person to put on and taking off regular upper body clothing?: None Help from another person to put on and taking off regular lower body clothing?: None 6 Click Score: 24   End of Session Equipment Utilized During Treatment: Gait belt Nurse Communication: Mobility status;Precautions  Activity Tolerance: Patient tolerated treatment well Patient left: in chair;with call bell/phone within reach;with chair alarm set  OT Visit Diagnosis: Other symptoms and signs involving the nervous system (R29.898)                Time: 8341-9622 OT Time Calculation (min): 19 min Charges:  OT General Charges $OT Visit: 1 Visit OT Evaluation $OT Eval Low Complexity: 1 Low   Brynn, OTR/L  Acute Rehabilitation Services Pager: [phone] Office: [phone] .   Jeri Modena 07/07/2019, 1:03 PM

## 2019-07-08 DIAGNOSIS — R4701 Aphasia: Secondary | ICD-10-CM

## 2019-07-08 DIAGNOSIS — I10 Essential (primary) hypertension: Secondary | ICD-10-CM

## 2019-07-08 DIAGNOSIS — E785 Hyperlipidemia, unspecified: Secondary | ICD-10-CM

## 2019-07-08 LAB — GLUCOSE, CAPILLARY
Glucose-Capillary: 210 mg/dL — ABNORMAL HIGH (ref 70–99)
Glucose-Capillary: 281 mg/dL — ABNORMAL HIGH (ref 70–99)

## 2019-07-08 MED ORDER — ATORVASTATIN CALCIUM 80 MG PO TABS: 80.0000 mg | ORAL_TABLET | Freq: Every day | ORAL | 1 refills | Status: AC

## 2019-07-08 MED ORDER — INSULIN ASPART 100 UNIT/ML ~~LOC~~ SOLN: SUBCUTANEOUS | 11 refills | Status: AC

## 2019-07-08 MED ORDER — BLOOD GLUCOSE MONITOR KIT: PACK | 0 refills | Status: AC

## 2019-07-08 MED ORDER — AMLODIPINE BESYLATE 10 MG PO TABS: 10.0000 mg | ORAL_TABLET | Freq: Every day | ORAL | 1 refills | Status: AC

## 2019-07-08 MED ORDER — ASPIRIN 81 MG PO TBEC: 81.0000 mg | DELAYED_RELEASE_TABLET | Freq: Every day | ORAL | 1 refills | Status: AC

## 2019-07-08 MED ORDER — INSULIN GLARGINE 100 UNIT/ML ~~LOC~~ SOLN: 10.0000 [IU] | Freq: Every day | SUBCUTANEOUS | 11 refills | Status: AC

## 2019-07-08 MED ORDER — ASPIRIN EC 81 MG PO TBEC: 81.0000 mg | DELAYED_RELEASE_TABLET | Freq: Every day | ORAL | Status: DC

## 2019-07-08 MED ORDER — AMLODIPINE BESYLATE 10 MG PO TABS: 10.0000 mg | ORAL_TABLET | Freq: Every day | ORAL | Status: DC

## 2019-07-08 MED ORDER — GLUCERNA SHAKE PO LIQD: 237.0000 mL | Freq: Three times a day (TID) | ORAL | 0 refills | Status: AC

## 2019-07-08 MED ORDER — ADULT MULTIVITAMIN W/MINERALS CH: 1.0000 | ORAL_TABLET | Freq: Every day | ORAL | Status: AC

## 2019-07-08 MED ORDER — SENNOSIDES-DOCUSATE SODIUM 8.6-50 MG PO TABS: 1.0000 | ORAL_TABLET | Freq: Every evening | ORAL | Status: AC | PRN

## 2019-07-08 NOTE — Progress Notes (Signed)
Discharge order received, patient declined consultation with diabetic educator. Patient stated he will have daughter who is a Charity fundraiser go over medication administration, care and diet with him at home.

## 2019-07-08 NOTE — Progress Notes (Signed)
STROKE TEAM PROGRESS NOTE     INTERVAL HISTORY He does not feel any weakness or numbness.  He says that he was not taking any antiplatelet agent before stroke contrary to the H&P.     OBJECTIVE Vitals:   07/07/19 1856 07/07/19 2003 07/07/19 2320 07/08/19 0316  BP: (!) 192/102 (!) 208/102 (!) 164/96 (!) 159/92  Pulse: 75 98 79 60  Resp:  Temp:  (!) 97.5 F (36.4 C) 98 F (36.7 C) 97.9 F (36.6 C)  TempSrc:  Oral Oral Oral  SpO2:  98% 100% 100%  Weight:      Height:        CBC:  Recent Labs  Lab 07/06/19 1200  WBC 8.0  NEUTROABS 4.9  HGB 16.1  HCT 47.5  MCV 94.1  PLT 248    Basic Metabolic Panel:  Recent Labs  Lab 07/06/19 1200  NA 138  K 3.7  CL 102  CO2 28  GLUCOSE 189*  BUN 13  CREATININE 0.68  CALCIUM 8.9    Lipid Panel:     Component Value Date/Time   CHOL 203 (H) 07/06/2019 1200   TRIG 97 07/06/2019 1200   HDL 43 07/06/2019 1200   CHOLHDL 4.7 07/06/2019 1200   VLDL 19 07/06/2019 1200   LDLCALC 141 (H) 07/06/2019 1200   HgbA1c:  Lab Results  Component Value Date   HGBA1C 9.0 (H) 07/06/2019   Urine Drug Screen: No results found for: LABOPIA, COCAINSCRNUR, LABBENZ, AMPHETMU, THCU, LABBARB  Alcohol Level No results found for: ETH  IMAGING   CT Angio Head W or Wo Contrast  Result Date: 07/06/2019 CLINICAL DATA:  69 year old male with slurred speech upon waking today. EXAM: CT ANGIOGRAPHY HEAD AND NECK TECHNIQUE: Multidetector CT imaging of the head and neck was performed using the standard protocol during bolus administration of intravenous contrast. Multiplanar CT image reconstructions and MIPs were obtained to evaluate the vascular anatomy. Carotid stenosis measurements (when applicable) are obtained utilizing NASCET criteria, using the distal internal carotid diameter as the denominator. CONTRAST:  80mL OMNIPAQUE IOHEXOL 350 MG/ML SOLN COMPARISON:  Plain head CT 1219 hours today. FINDINGS: CT HEAD Brain: Hypodensity in the left  basal ganglia appears somewhat increased in conspicuity (series 4, image 17). No associated hemorrhage or mass effect. Mild for age cage in a white matter hypodensity elsewhere is stable. No midline shift, ventriculomegaly, mass effect, evidence of mass lesion, intracranial hemorrhage or evidence of cortically based acute infarction. No suspicious intracranial vascular hyperdensity. Calvarium and skull base: No acute osseous abnormality identified. Paranasal sinuses: Stable moderate to severe ethmoid and sphenoid sinus disease with mucosal thickening and opacification. Maxillary mucoperiosteal thickening. Tympanic cavities and mastoids remain well pneumatized. Orbits: No acute orbit or scalp soft tissue finding. CTA NECK Skeleton: Scattered dental caries. Lower cervical spine degeneration. No acute osseous abnormality identified. Upper chest: Negative. Other neck: Subcentimeter bilateral thyroid nodules do not meet consensus criteria for ultrasound follow-up. (Ref: J Am Coll Radiol. 2015 Feb;12(2): 143-50).Other neck soft tissues are within normal limits. Aortic arch: Calcified aortic atherosclerosis. Three vessel arch configuration. Right carotid system: Brachiocephalic artery and right CCA origin plaque without stenosis. Only mild plaque at the right ICA origin but bulky circumferential soft plaque at the right ICA bulb resulting in stenosis numerically estimated at 70 % with respect to the distal vessel. See series 10, image 136 and series 9, image 222. Beyond this level the right ICA is patent with a normal caliber to the skull  base. Left carotid system: Left CCA origin plaque without stenosis. Soft plaque in the medial left CCA proximal to the bifurcation without stenosis. Mild soft and calcified plaque in the proximal left ICA without stenosis to the skull base. Vertebral arteries: Minimal plaque in the proximal right subclavian artery without stenosis. Normal right vertebral artery origin. Patent right  vertebral artery to the skull base without stenosis. Bulky soft plaque in the proximal left subclavian artery (series 11, image 128) with 50 % stenosis with respect to the distal vessel. Mild soft plaque at the left vertebral artery origin without stenosis. The left vertebral appears mildly dominant and is then patent to the skull base without additional plaque. CTA HEAD Posterior circulation: Dominant left V4 segment. Mild to moderate left V4 segment plaque without significant stenosis. Patent PICA origins. No right V4 stenosis. Patent vertebrobasilar junction. Patent basilar artery without stenosis. Patent AICA, SCA and PCA origins. Posterior communicating arteries are diminutive or absent. Bilateral PCA branches are within normal limits. Anterior circulation: Both ICA siphons are patent. On the left there is mild calcified plaque without stenosis. Normal left ophthalmic artery origin. On the right there is moderate calcified plaque with mild to moderate supraclinoid segment stenosis (series 9, image 119). Normal right ophthalmic artery origin. Patent carotid termini. Normal MCA and right ACA origins. There is a moderate to severe stenosis at the left ACA origin (series 13, image 19). Anterior communicating artery and bilateral ACA branches are within normal limits. Right MCA M1 segment and bifurcation are patent without stenosis. Right MCA branches are within normal limits. Left MCA M1 segment and bifurcation are patent without stenosis. There is mild irregularity of the dominant left M2 branch on series 14, image 29 without significant stenosis. Venous sinuses: Patent, prominent left arachnoid granulation at the junction of the transverse and sigmoid sinuses (normal variant). Anatomic variants: Dominant left vertebral artery. Review of the MIP images confirms the above findings IMPRESSION: 1. Negative for large vessel occlusion. 2. Positive for bulky soft plaque at the Right ICA bulb with high-grade stenosis  numerically estimated at 70%. Superimposed mild to moderate distal right ICA supraclinoid stenosis due to calcified plaque. 3. Moderate to severe stenosis at the Left ACA origin. 4. No other hemodynamically significant large vessel stenosis. 5. Left basal ganglia lacunar infarct with no hemorrhage or mass effect may be acute to subacute. 6. No new intracranial abnormality. 7. Bilateral paranasal sinusitis. 8. Aortic Atherosclerosis (ICD10-I70.0). Electronically Signed   By: Odessa Fleming M.D.   On: 07/06/2019 17:00   CT Head Wo Contrast  Result Date: 07/06/2019 CLINICAL DATA:  Woke up Monday morning with slurred speech. Still slightly slurred speech. EXAM: CT HEAD WITHOUT CONTRAST TECHNIQUE: Contiguous axial images were obtained from the base of the skull through the vertex without intravenous contrast. COMPARISON:  None. FINDINGS: Brain: The ventricles are in the midline without mass effect or shift. No extra-axial fluid collections identified. Patchy white matter changes, likely microvascular ischemic change. There is a slightly rounded area of low attenuation in the deep white periventricular white matter suspicious for a acute or subacute infarct. MR imaging is suggested for further evaluation. No hemispheric infarction or intracranial hemorrhage. The brainstem and cerebellum appear normal. Vascular: Scattered vascular calcifications. No definite hyperdense vessels or aneurysm. Skull: No skull fracture or bone lesions. Sinuses/Orbits: Near complete opacification of the sphenoid sinus and fairly marked mucoperiosteal thickening involving the ethmoid air cells and maxillary sinuses. Scattered mastoid effusions are noted on the left side. Other: No scalp  lesions or hematoma. IMPRESSION: 1. Suspect acute or subacute left periventricular white matter infarct. Recommend MR imaging for further evaluation. 2. No findings for hemispheric infarction or intracranial hemorrhage. 3. Paranasal sinus disease and left-sided  mastoid effusions. Electronically Signed   By: Rudie Meyer M.D.   On: 07/06/2019 12:31   CT Angio Neck W and/or Wo Contrast  Result Date: 07/06/2019 CLINICAL DATA:  69 year old male with slurred speech upon waking today. EXAM: CT ANGIOGRAPHY HEAD AND NECK TECHNIQUE: Multidetector CT imaging of the head and neck was performed using the standard protocol during bolus administration of intravenous contrast. Multiplanar CT image reconstructions and MIPs were obtained to evaluate the vascular anatomy. Carotid stenosis measurements (when applicable) are obtained utilizing NASCET criteria, using the distal internal carotid diameter as the denominator. CONTRAST:  80mL OMNIPAQUE IOHEXOL 350 MG/ML SOLN COMPARISON:  Plain head CT 1219 hours today. FINDINGS: CT HEAD Brain: Hypodensity in the left basal ganglia appears somewhat increased in conspicuity (series 4, image 17). No associated hemorrhage or mass effect. Mild for age cage in a white matter hypodensity elsewhere is stable. No midline shift, ventriculomegaly, mass effect, evidence of mass lesion, intracranial hemorrhage or evidence of cortically based acute infarction. No suspicious intracranial vascular hyperdensity. Calvarium and skull base: No acute osseous abnormality identified. Paranasal sinuses: Stable moderate to severe ethmoid and sphenoid sinus disease with mucosal thickening and opacification. Maxillary mucoperiosteal thickening. Tympanic cavities and mastoids remain well pneumatized. Orbits: No acute orbit or scalp soft tissue finding. CTA NECK Skeleton: Scattered dental caries. Lower cervical spine degeneration. No acute osseous abnormality identified. Upper chest: Negative. Other neck: Subcentimeter bilateral thyroid nodules do not meet consensus criteria for ultrasound follow-up. (Ref: J Am Coll Radiol. 2015 Feb;12(2): 143-50).Other neck soft tissues are within normal limits. Aortic arch: Calcified aortic atherosclerosis. Three vessel arch  configuration. Right carotid system: Brachiocephalic artery and right CCA origin plaque without stenosis. Only mild plaque at the right ICA origin but bulky circumferential soft plaque at the right ICA bulb resulting in stenosis numerically estimated at 70 % with respect to the distal vessel. See series 10, image 136 and series 9, image 222. Beyond this level the right ICA is patent with a normal caliber to the skull base. Left carotid system: Left CCA origin plaque without stenosis. Soft plaque in the medial left CCA proximal to the bifurcation without stenosis. Mild soft and calcified plaque in the proximal left ICA without stenosis to the skull base. Vertebral arteries: Minimal plaque in the proximal right subclavian artery without stenosis. Normal right vertebral artery origin. Patent right vertebral artery to the skull base without stenosis. Bulky soft plaque in the proximal left subclavian artery (series 11, image 128) with 50 % stenosis with respect to the distal vessel. Mild soft plaque at the left vertebral artery origin without stenosis. The left vertebral appears mildly dominant and is then patent to the skull base without additional plaque. CTA HEAD Posterior circulation: Dominant left V4 segment. Mild to moderate left V4 segment plaque without significant stenosis. Patent PICA origins. No right V4 stenosis. Patent vertebrobasilar junction. Patent basilar artery without stenosis. Patent AICA, SCA and PCA origins. Posterior communicating arteries are diminutive or absent. Bilateral PCA branches are within normal limits. Anterior circulation: Both ICA siphons are patent. On the left there is mild calcified plaque without stenosis. Normal left ophthalmic artery origin. On the right there is moderate calcified plaque with mild to moderate supraclinoid segment stenosis (series 9, image 119). Normal right ophthalmic artery origin. Patent carotid  termini. Normal MCA and right ACA origins. There is a moderate to  severe stenosis at the left ACA origin (series 13, image 19). Anterior communicating artery and bilateral ACA branches are within normal limits. Right MCA M1 segment and bifurcation are patent without stenosis. Right MCA branches are within normal limits. Left MCA M1 segment and bifurcation are patent without stenosis. There is mild irregularity of the dominant left M2 branch on series 14, image 29 without significant stenosis. Venous sinuses: Patent, prominent left arachnoid granulation at the junction of the transverse and sigmoid sinuses (normal variant). Anatomic variants: Dominant left vertebral artery. Review of the MIP images confirms the above findings IMPRESSION: 1. Negative for large vessel occlusion. 2. Positive for bulky soft plaque at the Right ICA bulb with high-grade stenosis numerically estimated at 70%. Superimposed mild to moderate distal right ICA supraclinoid stenosis due to calcified plaque. 3. Moderate to severe stenosis at the Left ACA origin. 4. No other hemodynamically significant large vessel stenosis. 5. Left basal ganglia lacunar infarct with no hemorrhage or mass effect may be acute to subacute. 6. No new intracranial abnormality. 7. Bilateral paranasal sinusitis. 8. Aortic Atherosclerosis (ICD10-I70.0). Electronically Signed   By: Odessa Fleming M.D.   On: 07/06/2019 17:00   MR BRAIN WO CONTRAST  Result Date: 07/07/2019 CLINICAL DATA:  Slurred speech, word-finding difficulty and right facial droop. EXAM: MRI HEAD WITHOUT CONTRAST TECHNIQUE: Multiplanar, multiecho pulse sequences of the brain and surrounding structures were obtained without intravenous contrast. COMPARISON:  Head CT July 04, 2018 FINDINGS: Brain: Small area of restricted diffusion in the left basal ganglia, involving the body of the caudate extending to the posterior lentiform nucleus, corresponding to hypodense area on recent head CT. No other focus of restricted diffusion or hemorrhage. Scattered foci of T2  hyperintensity are seen within the white matter of the cerebral hemispheres and within the pons, nonspecific, most likely related to chronic small vessel ischemia. Vascular: Normal flow voids. Skull and upper cervical spine: Normal marrow signal. Sinuses/Orbits: Obliteration of the sphenoid sinuses with mucosal thickening and T1 hyperintense/T2 hypointense content, likely inspissated secretion. There is also mucosal thickening of the frontal and maxillary sinuses and ethmoid cells. The orbits are preserved. Other: Bilateral mastoid effusions. IMPRESSION: 1. Small area of restricted diffusion in the left basal ganglia, involving the body of the caudate extending to the posterior lentiform nucleus, corresponding to hypodense area on recent head CT. Findings are consistent with acute to subacute infarct. 2. Scattered foci of T2 hyperintensity within the white matter of the cerebral hemispheres and pons, nonspecific, but most likely related to chronic small vessel ischemia. Electronically Signed   By: Baldemar Lenis M.D.   On: 07/07/2019 09:31   US Carotid Duplex Bilateral  Result Date: 07/07/2019 CLINICAL DATA:  Visual numbness and speech loss. EXAM: BILATERAL CAROTID DUPLEX ULTRASOUND TECHNIQUE: Wallace Cullens scale imaging, color Doppler and duplex ultrasound were performed of bilateral carotid and vertebral arteries in the neck. COMPARISON:  None. FINDINGS: Criteria: Quantification of carotid stenosis is based on velocity parameters that correlate the residual internal carotid diameter with NASCET-based stenosis levels, using the diameter of the distal internal carotid lumen as the denominator for stenosis measurement. The following velocity measurements were obtained: RIGHT ICA:  266/59 cm/sec CCA:  70/11 cm/sec SYSTOLIC ICA/CCA RATIO:  3.7 ECA:  102 cm/sec LEFT ICA:  81/19 cm/sec CCA:  93/16 cm/sec SYSTOLIC ICA/CCA RATIO:  0.9 ECA:  237 cm/sec RIGHT CAROTID ARTERY: Moderate calcified plaque is present at  the  level of the carotid bulb and proximal right ICA. Velocity elevation corresponds to an estimated greater than 70% proximal right ICA stenosis. RIGHT VERTEBRAL ARTERY: Antegrade flow with normal waveform and velocity. LEFT CAROTID ARTERY: Moderate partially calcified plaque at the level of the carotid bulb and proximal left ICA. Estimated left ICA stenosis is less than 50%. LEFT VERTEBRAL ARTERY: Antegrade flow with normal waveform and velocity. IMPRESSION: Significant plaque at the level of both carotid bulbs and proximal internal carotid arteries, right greater than left. Estimated proximal right ICA stenosis is greater than 70%. Estimated proximal left ICA stenosis is less than 50%. Electronically Signed   By: Irish Lack M.D.   On: 07/07/2019 07:56   ECHOCARDIOGRAM COMPLETE  Result Date: 07/07/2019    ECHOCARDIOGRAM REPORT   Patient Name:   Samuel Bradshaw Date of Exam: 07/07/2019 Medical Rec #:  [id number]        Height:       74.0 in Accession #:    [id number]       Weight:       215.0 lb Date of Birth:  26-Jun-1950        BSA:          2.241 m Patient Age:    68 years         BP:           172/100 mmHg Patient Gender: M                HR:           67 bpm. Exam Location:  Inpatient Procedure: 2D Echo, Cardiac Doppler and Color Doppler Indications:    Stroke.  History:        Patient has no prior history of Echocardiogram examinations.                 Stroke; Risk Factors:Hypertension, Diabetes, Dyslipidemia and                 Current Smoker.  Sonographer:    Sheralyn Boatman RDCS Referring Phys: [id number] [facility]  Sonographer Comments: Interrupted 3 times. IMPRESSIONS  1. Left ventricular ejection fraction, by estimation, is 60 to 65%. The left ventricle has normal function. The left ventricle has no regional wall motion abnormalities. There is moderate concentric left ventricular hypertrophy. Left ventricular diastolic parameters are consistent with Grade I diastolic dysfunction (impaired  relaxation).  2. Right ventricular systolic function is normal. The right ventricular size is normal. Tricuspid regurgitation signal is inadequate for assessing PA pressure.  3. Left atrial size was mildly dilated.  4. The mitral valve is grossly normal. Mild mitral valve regurgitation. No evidence of mitral stenosis.  5. The aortic valve is tricuspid. Aortic valve regurgitation is not visualized. Mild aortic valve sclerosis is present, with no evidence of aortic valve stenosis.  6. 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  7. The inferior vena cava is dilated in size with >50% respiratory variability, suggesting right atrial pressure of 8 mmHg. Conclusion(s)/Recommendation(s): No intracardiac source of embolism detected on this transthoracic study. A transesophageal echocardiogram is recommended to exclude cardiac source of embolism if clinically indicated. FINDINGS  Left Ventricle: Left ventricular ejection fraction, by estimation, is 60 to 65%. The left ventricle has normal function. The left ventricle has no regional wall motion abnormalities. The left ventricular internal cavity size was normal in size. There is  moderate concentric left ventricular hypertrophy. Left ventricular diastolic parameters are consistent with  Grade I diastolic dysfunction (impaired relaxation). Normal left ventricular filling pressure. Right Ventricle: The right ventricular size is normal. No increase in right ventricular wall thickness. Right ventricular systolic function is normal. Tricuspid regurgitation signal is inadequate for assessing PA pressure. Left Atrium: Left atrial size was mildly dilated. Right Atrium: Right atrial size was normal in size. Pericardium: There is no evidence of pericardial effusion. Presence of pericardial fat pad. Mitral Valve: The mitral valve is grossly normal. Mild mitral annular calcification. Mild mitral valve regurgitation. No evidence of mitral valve stenosis. Tricuspid  Valve: The tricuspid valve is grossly normal. Tricuspid valve regurgitation is not demonstrated. No evidence of tricuspid stenosis. Aortic Valve: The aortic valve is tricuspid. Aortic valve regurgitation is not visualized. Mild aortic valve sclerosis is present, with no evidence of aortic valve stenosis. Pulmonic Valve: The pulmonic valve was grossly normal. Pulmonic valve regurgitation is not visualized. No evidence of pulmonic stenosis. Aorta: The aortic root is normal in size and structure. 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 Venous: The inferior vena cava is dilated in size with greater than 50% respiratory variability, suggesting right atrial pressure of 8 mmHg. IAS/Shunts: The interatrial septum appears to be lipomatous. The atrial septum is grossly normal.  LEFT VENTRICLE PLAX 2D LVIDd:         4.86 cm      Diastology LVIDs:         2.96 cm      LV e' lateral:   3.92 cm/s LV PW:         1.46 cm      LV E/e' lateral: 13.7 LV IVS:        1.40 cm      LV e' medial:    4.03 cm/s LVOT diam:     2.20 cm      LV E/e' medial:  13.3 LV SV:         87 LV SV Index:   39 LVOT Area:     3.80 cm  LV Volumes (MOD) LV vol d, MOD A2C: 143.0 ml LV vol s, MOD A2C: 60.9 ml LV vol s, MOD A4C: 48.3 ml LV SV MOD A2C:     82.1 ml RIGHT VENTRICLE             IVC RV S prime:     12.80 cm/s  IVC diam: 2.73 cm TAPSE (M-mode): 2.6 cm LEFT ATRIUM             Index       RIGHT ATRIUM           Index LA diam:        4.60 cm 2.05 cm/m  RA Area:     20.20 cm LA Vol (A2C):   98.4 ml 43.90 ml/m RA Volume:   51.80 ml  23.11 ml/m LA Vol (A4C):   80.4 ml 35.87 ml/m LA Biplane Vol: 90.1 ml 40.20 ml/m  AORTIC VALVE LVOT Vmax:   106.00 cm/s LVOT Vmean:  72.700 cm/s LVOT VTI:    0.228 m  AORTA Ao Root diam: 3.70 cm Ao Asc diam:  4.00 cm MITRAL VALVE MV Area (PHT): 4.15 cm     SHUNTS MV Decel Time: 183 msec     Systemic VTI:  0.23 m MV E velocity: 53.60 cm/s   Systemic Diam: 2.20 cm MV A velocity: 115.00 cm/s MV E/A  ratio:  0.47 Eleonore Chiquito MD Electronically signed by Eleonore Chiquito MD Signature Date/Time: 07/07/2019/2:15:04  PM    Final    VAS US CAROTID  Result Date: 07/07/2019 Carotid Arterial Duplex Study Indications:       Stenosis seen on CTA. Comparison Study:  07/06/2019 Performing Technologist: Gertie FeyMichelle Simonetti MHA, RDMS, RVT, RDCS  Examination Guidelines: A complete evaluation includes B-mode imaging, spectral Doppler, color Doppler, and power Doppler as needed of all accessible portions of each vessel. Bilateral testing is considered an integral part of a complete examination. Limited examinations for reoccurring indications may be performed as noted.  Right Carotid Findings: +----------+--------+--------+--------+--------------------------+--------+           PSV cm/sEDV cm/sStenosisPlaque Description        Comments +----------+--------+--------+--------+--------------------------+--------+ CCA Prox  76      11                                                 +----------+--------+--------+--------+--------------------------+--------+ CCA Distal59      13              smooth and heterogenous            +----------+--------+--------+--------+--------------------------+--------+ ICA Prox  259     73      60-79%  irregular and heterogenous         +----------+--------+--------+--------+--------------------------+--------+ ICA Distal135     37                                                 +----------+--------+--------+--------+--------------------------+--------+ ECA       91      16                                                 +----------+--------+--------+--------+--------------------------+--------+ +----------+--------+-------+----------------+-------------------+           PSV cm/sEDV cmsDescribe        Arm Pressure (mmHG) +----------+--------+-------+----------------+-------------------+ [id number]             Multiphasic, WNL                     +----------+--------+-------+----------------+-------------------+ +---------+--------+--+--------+-+---------+ VertebralPSV cm/s50EDV cm/s9Antegrade +---------+--------+--+--------+-+---------+  Left Carotid Findings: +----------+-------+-------+--------+---------------------------------+--------+           PSV    EDV    StenosisPlaque Description               Comments           cm/s   cm/s                                                     +----------+-------+-------+--------+---------------------------------+--------+ CCA Prox  75     11                                                       +----------+-------+-------+--------+---------------------------------+--------+ CCA Distal59  13             smooth and heterogenous                   +----------+-------+-------+--------+---------------------------------+--------+ ICA Prox  49     13             heterogenous, irregular and                                               calcific                                  +----------+-------+-------+--------+---------------------------------+--------+ ICA Distal87     26                                                       +----------+-------+-------+--------+---------------------------------+--------+ ECA       158    16             heterogenous and smooth                   +----------+-------+-------+--------+---------------------------------+--------+ +----------+--------+--------+----------------+-------------------+           PSV cm/sEDV cm/sDescribe        Arm Pressure (mmHG) +----------+--------+--------+----------------+-------------------+ [id number]              Multiphasic, WNL                    +----------+--------+--------+----------------+-------------------+ +---------+--------+--+--------+--+---------+ VertebralPSV cm/s52EDV cm/s13Antegrade +---------+--------+--+--------+--+---------+   Summary: Right Carotid:  Velocities in the right ICA are consistent with a 60-79%                stenosis. Left Carotid: Velocities in the left ICA are consistent with a 1-39% stenosis. Vertebrals:  Bilateral vertebral arteries demonstrate antegrade flow. Subclavians: Normal flow hemodynamics were seen in bilateral subclavian              arteries. *See table(s) above for measurements and observations.  Electronically signed by Waverly Ferrari MD on 07/07/2019 at 4:49:31 PM.    Final      Transthoracic Echocardiogram  00/00/2021 See impression    Bilateral Carotid Dopplers  00/00/2021 See impression   ECG - SR rate . (See cardiology reading for complete details)   E   PHYSICAL EXAM Blood pressure (!) 159/92, pulse 60, temperature 97.9 F (36.6 C), temperature source Oral, resp. rate 18, height 6\' 2"  (1.88 m), weight 97.5 kg, SpO2 100 %.   Awake, alert, fully oriented. Language -fluent Comprehension, naming, repetition- intact. Face symmetrical.  Tongue midline. EOMI. Strength 5/5 BUE and BLE, except for pronator drift on the right. Sensory - intact bilateral. Coord - FTN intact bilateral. Gait - normal.       HOME MEDICATIONS:  Medications Prior to Admission  Medication Sig Dispense Refill  . ibuprofen (ADVIL) 200 MG tablet Take 600 mg by mouth every 6 (six) hours as needed for moderate pain. 400mg -600mg  as needed.        HOSPITAL MEDICATIONS:  . amLODipine  5 mg Oral Daily  . aspirin EC  325 mg Oral Daily  . atorvastatin  80 mg Oral q1800  .  feeding supplement (GLUCERNA SHAKE)  237 mL Oral TID BM  . heparin  5,000 Units Subcutaneous Q8H  . insulin aspart  0-9 Units Subcutaneous TID WC  . insulin glargine  8 Units Subcutaneous QHS  . multivitamin with minerals  1 tablet Oral Daily    ALLERGIES Allergies  Allergen Reactions  . Bee Venom     PAST MEDICAL HISTORY History reviewed. No pertinent past medical history.  SURGICAL HISTORY History reviewed. No pertinent surgical  history.  FAMILY HISTORY History reviewed. No pertinent family history.  SOCIAL HISTORY  reports that he has been smoking cigarettes. He has a 25.00 pack-year smoking history. He has never used smokeless tobacco. He reports current alcohol use of about 6.0 standard drinks of alcohol per week. No history on file for drug.  ASSESSMENT/PLAN Samuel Bradshaw is a 69 y.o. male with history of HTN, DM, High cholesterol, smoking presenting with right sided weakness. 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 Permissive hypertension (OK if < 220/120) but gradually normalize in 5-7 days  . 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 He can be discharged on ASA 81 mg qd.  His BP is still not optimally controlled, so I recommend you increase Norvasc to 10 mg qd.  His DM is poorly controlled with A1c of 9%.  He is asked to have close outpatient follow up with PCP.  His LDL cholesterol goal is <70, but he is double that.  He has been started on statin and will continue that.  He is a smoker and will attempt to quit as outpatient.   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  Vascular  surgery will follow this as outpatient.    No evidence of cardioembolism or intracranial stenosis.    Follow up in stroke clinic in 4-6 weeks. Will sign off.  Weston Settle, MS, MD    To contact Stroke Continuity provider, please refer to [email or web address]. After hours, contact General Neurology

## 2019-07-10 NOTE — Discharge Summary (Signed)
Physician Discharge Summary  LYNELL KUSSMAN NLG:[id number] DOB: November 13, 1950 DOA: 07/06/2019  PCP: Patient, No Pcp Per  Admit date: 07/06/2019 Discharge date: 07/08/2019  Admitted From: Home.  Disposition:  Home.   Recommendations for Outpatient Follow-up:  1. Follow up with PCP in 1-2 weeks 2. Please obtain BMP/CBC in one week 3. Please follow up with endocrinology in 1 to 2 weeks.  4. Please follow up with neurology as recommended in 4 weeks to 6 weeks.  5. Please follow up with outpatient DM education for uncontrolled DM.     Discharge Condition:stable.  CODE STATUS: full code.  Diet recommendation: Heart Healthy Brief/Interim Summary: 69 year old gentleman prior history significant for hypertension and diabetes not on any medication presents to ED for slurred speech and left facial droop. He was admitted for evaluation of stroke.  Neurology consulted and is on board.  Discharge Diagnoses:  Principal Problem:   Acute CVA (cerebrovascular accident) [facility]) Active Problems:   Essential hypertension   Dyslipidemia   Diabetes mellitus ([place])   Tobacco use  Acute left basal ganglia infarct:  Admitted for stroke evaluation.  MRI brain without contrast shows Small area of restricted diffusion in the left basal ganglia, involving the body of the caudate extending to the posterior lentiform nucleus.  Scattered foci of T2 hyperintensity within the white matter of the cerebral hemispheres and pons, nonspecific, but most likely related to chronic small vessel ischemia. CT angio of the head and neck shows Positive for bulky soft plaque at the Right ICA bulb with high-grade stenosis numerically estimated at 70%. Superimposed mild to moderate distal right ICA supraclinoid stenosis due to calcified plaque. Moderate to severe stenosis at the Left ACA origin.  Echocardiogram ordered for further evaluation, did not show any thrombus .  Vascular surgery consulted for evaluation of the right ICA  stenosis , recommended outpatient follow up.  LDL is 141 and A1c is 9.  Started the patient on aspirin 81 mg daily and atorvastatin 80 mg daily.    Essential hypertension:  Suboptimally controlled.  Patient wanted to leave AMA and did not want to wait to be given anti hypertensive meds, and RN discharged the patient.    Uncontrolled DM:  A1C IS 9. Started the patient on lantus on discharge.  Outpatient DM education consult placed. DM co ordinator consult placed, but patient did not want to wait and wanted to leave.     Hyperlipidemia:   Started the patient on atorvastatin 80 mg daily.     Discharge Instructions  Discharge Instructions    Diet - low sodium heart healthy   Complete by: As directed    Discharge instructions   Complete by: As directed    Please follow up with neurology in 4 to 6 weeks.  Please follow up with PCP  in one week.  Please follow up with vascular surgery as recommended.     Allergies as of 07/08/2019      Reactions   Bee Venom       Medication List    STOP taking these medications   ibuprofen 200 MG tablet Commonly known as: ADVIL     TAKE these medications   amLODipine 10 MG tablet Commonly known as: NORVASC Take 1 tablet (10 mg total) by mouth daily.   aspirin 81 MG EC tablet Take 1 tablet (81 mg total) by mouth daily.   atorvastatin 80 MG tablet Commonly known as: LIPITOR Take 1 tablet (80 mg total) by mouth daily at 6 PM.  blood glucose meter kit and supplies Kit Dispense based on patient and insurance preference. Use up to four times daily as directed. (FOR ICD-9 250.00, 250.01).   feeding supplement (GLUCERNA SHAKE) Liqd Take 237 mLs by mouth 3 (three) times daily between meals.   insulin aspart 100 UNIT/ML injection Commonly known as: novoLOG CBG 121 - 150: 1 unit CBG 151 - 200: 2 units CBG 201 - 250: 3 units CBG 251 - 300: 5 units CBG 301 - 350: 7 units CBG 351 - 400: 9 units   insulin glargine 100 UNIT/ML  injection Commonly known as: LANTUS Inject 0.1 mLs (10 Units total) into the skin at bedtime.   multivitamin with minerals Tabs tablet Take 1 tablet by mouth daily.   senna-docusate 8.6-50 MG tablet Commonly known as: Senokot-S Take 1 tablet by mouth at bedtime as needed for mild constipation or moderate constipation.      Follow-up Information    Schedule an appointment as soon as possible for a visit  with Phillips Odor, MD.   Specialty: Neurology Contact information: [address] [place] [address] [phone]        Schedule an appointment as soon as possible for a visit  with [facility].   Contact information: [place], [place] [phone]       PRIMARY CARE PHYSICIAN. Schedule an appointment as soon as possible for a visit in 1 week(s).          Allergies  Allergen Reactions  . Bee Venom     Consultations:  Neurology.    Procedures/Studies: CT Angio Head W or Wo Contrast  Result Date: 07/06/2019 CLINICAL DATA:  69 year old male with slurred speech upon waking today. EXAM: CT ANGIOGRAPHY HEAD AND NECK TECHNIQUE: Multidetector CT imaging of the head and neck was performed using the standard protocol during bolus administration of intravenous contrast. Multiplanar CT image reconstructions and MIPs were obtained to evaluate the vascular anatomy. Carotid stenosis measurements (when applicable) are obtained utilizing NASCET criteria, using the distal internal carotid diameter as the denominator. CONTRAST:  12m OMNIPAQUE IOHEXOL 350 MG/ML SOLN COMPARISON:  Plain head CT 1219 hours today. FINDINGS: CT HEAD Brain: Hypodensity in the left basal ganglia appears somewhat increased in conspicuity (series 4, image 17). No associated hemorrhage or mass effect. Mild for age cage in a white matter hypodensity elsewhere is stable. No midline shift, ventriculomegaly, mass effect, evidence of mass lesion,  intracranial hemorrhage or evidence of cortically based acute infarction. No suspicious intracranial vascular hyperdensity. Calvarium and skull base: No acute osseous abnormality identified. Paranasal sinuses: Stable moderate to severe ethmoid and sphenoid sinus disease with mucosal thickening and opacification. Maxillary mucoperiosteal thickening. Tympanic cavities and mastoids remain well pneumatized. Orbits: No acute orbit or scalp soft tissue finding. CTA NECK Skeleton: Scattered dental caries. Lower cervical spine degeneration. No acute osseous abnormality identified. Upper chest: Negative. Other neck: Subcentimeter bilateral thyroid nodules do not meet consensus criteria for ultrasound follow-up. (Ref: J Am Coll Radiol. 2015 Feb;12(2): 143-50).Other neck soft tissues are within normal limits. Aortic arch: Calcified aortic atherosclerosis. Three vessel arch configuration. Right carotid system: Brachiocephalic artery and right CCA origin plaque without stenosis. Only mild plaque at the right ICA origin but bulky circumferential soft plaque at the right ICA bulb resulting in stenosis numerically estimated at 70 % with respect to the distal vessel. See series 10, image 136 and series 9, image 222. Beyond this level the right ICA is patent with a  normal caliber to the skull base. Left carotid system: Left CCA origin plaque without stenosis. Soft plaque in the medial left CCA proximal to the bifurcation without stenosis. Mild soft and calcified plaque in the proximal left ICA without stenosis to the skull base. Vertebral arteries: Minimal plaque in the proximal right subclavian artery without stenosis. Normal right vertebral artery origin. Patent right vertebral artery to the skull base without stenosis. Bulky soft plaque in the proximal left subclavian artery (series 11, image 128) with 50 % stenosis with respect to the distal vessel. Mild soft plaque at the left vertebral artery origin without stenosis. The left  vertebral appears mildly dominant and is then patent to the skull base without additional plaque. CTA HEAD Posterior circulation: Dominant left V4 segment. Mild to moderate left V4 segment plaque without significant stenosis. Patent PICA origins. No right V4 stenosis. Patent vertebrobasilar junction. Patent basilar artery without stenosis. Patent AICA, SCA and PCA origins. Posterior communicating arteries are diminutive or absent. Bilateral PCA branches are within normal limits. Anterior circulation: Both ICA siphons are patent. On the left there is mild calcified plaque without stenosis. Normal left ophthalmic artery origin. On the right there is moderate calcified plaque with mild to moderate supraclinoid segment stenosis (series 9, image 119). Normal right ophthalmic artery origin. Patent carotid termini. Normal MCA and right ACA origins. There is a moderate to severe stenosis at the left ACA origin (series 13, image 19). Anterior communicating artery and bilateral ACA branches are within normal limits. Right MCA M1 segment and bifurcation are patent without stenosis. Right MCA branches are within normal limits. Left MCA M1 segment and bifurcation are patent without stenosis. There is mild irregularity of the dominant left M2 branch on series 14, image 29 without significant stenosis. Venous sinuses: Patent, prominent left arachnoid granulation at the junction of the transverse and sigmoid sinuses (normal variant). Anatomic variants: Dominant left vertebral artery. Review of the MIP images confirms the above findings IMPRESSION: 1. Negative for large vessel occlusion. 2. Positive for bulky soft plaque at the Right ICA bulb with high-grade stenosis numerically estimated at 70%. Superimposed mild to moderate distal right ICA supraclinoid stenosis due to calcified plaque. 3. Moderate to severe stenosis at the Left ACA origin. 4. No other hemodynamically significant large vessel stenosis. 5. Left basal ganglia  lacunar infarct with no hemorrhage or mass effect may be acute to subacute. 6. No new intracranial abnormality. 7. Bilateral paranasal sinusitis. 8. Aortic Atherosclerosis (ICD10-I70.0). Electronically Signed   By: Genevie Ann M.D.   On: 07/06/2019 17:00   CT Head Wo Contrast  Result Date: 07/06/2019 CLINICAL DATA:  Woke up Monday morning with slurred speech. Still slightly slurred speech. EXAM: CT HEAD WITHOUT CONTRAST TECHNIQUE: Contiguous axial images were obtained from the base of the skull through the vertex without intravenous contrast. COMPARISON:  None. FINDINGS: Brain: The ventricles are in the midline without mass effect or shift. No extra-axial fluid collections identified. Patchy white matter changes, likely microvascular ischemic change. There is a slightly rounded area of low attenuation in the deep white periventricular white matter suspicious for a acute or subacute infarct. MR imaging is suggested for further evaluation. No hemispheric infarction or intracranial hemorrhage. The brainstem and cerebellum appear normal. Vascular: Scattered vascular calcifications. No definite hyperdense vessels or aneurysm. Skull: No skull fracture or bone lesions. Sinuses/Orbits: Near complete opacification of the sphenoid sinus and fairly marked mucoperiosteal thickening involving the ethmoid air cells and maxillary sinuses. Scattered mastoid effusions are noted on the  left side. Other: No scalp lesions or hematoma. IMPRESSION: 1. Suspect acute or subacute left periventricular white matter infarct. Recommend MR imaging for further evaluation. 2. No findings for hemispheric infarction or intracranial hemorrhage. 3. Paranasal sinus disease and left-sided mastoid effusions. Electronically Signed   By: Marijo Sanes M.D.   On: 07/06/2019 12:31   CT Angio Neck W and/or Wo Contrast  Result Date: 07/06/2019 CLINICAL DATA:  69 year old male with slurred speech upon waking today. EXAM: CT ANGIOGRAPHY HEAD AND NECK  TECHNIQUE: Multidetector CT imaging of the head and neck was performed using the standard protocol during bolus administration of intravenous contrast. Multiplanar CT image reconstructions and MIPs were obtained to evaluate the vascular anatomy. Carotid stenosis measurements (when applicable) are obtained utilizing NASCET criteria, using the distal internal carotid diameter as the denominator. CONTRAST:  33m OMNIPAQUE IOHEXOL 350 MG/ML SOLN COMPARISON:  Plain head CT 1219 hours today. FINDINGS: CT HEAD Brain: Hypodensity in the left basal ganglia appears somewhat increased in conspicuity (series 4, image 17). No associated hemorrhage or mass effect. Mild for age cage in a white matter hypodensity elsewhere is stable. No midline shift, ventriculomegaly, mass effect, evidence of mass lesion, intracranial hemorrhage or evidence of cortically based acute infarction. No suspicious intracranial vascular hyperdensity. Calvarium and skull base: No acute osseous abnormality identified. Paranasal sinuses: Stable moderate to severe ethmoid and sphenoid sinus disease with mucosal thickening and opacification. Maxillary mucoperiosteal thickening. Tympanic cavities and mastoids remain well pneumatized. Orbits: No acute orbit or scalp soft tissue finding. CTA NECK Skeleton: Scattered dental caries. Lower cervical spine degeneration. No acute osseous abnormality identified. Upper chest: Negative. Other neck: Subcentimeter bilateral thyroid nodules do not meet consensus criteria for ultrasound follow-up. (Ref: J Am Coll Radiol. 2015 Feb;12(2): 143-50).Other neck soft tissues are within normal limits. Aortic arch: Calcified aortic atherosclerosis. Three vessel arch configuration. Right carotid system: Brachiocephalic artery and right CCA origin plaque without stenosis. Only mild plaque at the right ICA origin but bulky circumferential soft plaque at the right ICA bulb resulting in stenosis numerically estimated at 70 % with respect  to the distal vessel. See series 10, image 136 and series 9, image 222. Beyond this level the right ICA is patent with a normal caliber to the skull base. Left carotid system: Left CCA origin plaque without stenosis. Soft plaque in the medial left CCA proximal to the bifurcation without stenosis. Mild soft and calcified plaque in the proximal left ICA without stenosis to the skull base. Vertebral arteries: Minimal plaque in the proximal right subclavian artery without stenosis. Normal right vertebral artery origin. Patent right vertebral artery to the skull base without stenosis. Bulky soft plaque in the proximal left subclavian artery (series 11, image 128) with 50 % stenosis with respect to the distal vessel. Mild soft plaque at the left vertebral artery origin without stenosis. The left vertebral appears mildly dominant and is then patent to the skull base without additional plaque. CTA HEAD Posterior circulation: Dominant left V4 segment. Mild to moderate left V4 segment plaque without significant stenosis. Patent PICA origins. No right V4 stenosis. Patent vertebrobasilar junction. Patent basilar artery without stenosis. Patent AICA, SCA and PCA origins. Posterior communicating arteries are diminutive or absent. Bilateral PCA branches are within normal limits. Anterior circulation: Both ICA siphons are patent. On the left there is mild calcified plaque without stenosis. Normal left ophthalmic artery origin. On the right there is moderate calcified plaque with mild to moderate supraclinoid segment stenosis (series 9, image 119). Normal right  ophthalmic artery origin. Patent carotid termini. Normal MCA and right ACA origins. There is a moderate to severe stenosis at the left ACA origin (series 13, image 19). Anterior communicating artery and bilateral ACA branches are within normal limits. Right MCA M1 segment and bifurcation are patent without stenosis. Right MCA branches are within normal limits. Left MCA M1  segment and bifurcation are patent without stenosis. There is mild irregularity of the dominant left M2 branch on series 14, image 29 without significant stenosis. Venous sinuses: Patent, prominent left arachnoid granulation at the junction of the transverse and sigmoid sinuses (normal variant). Anatomic variants: Dominant left vertebral artery. Review of the MIP images confirms the above findings IMPRESSION: 1. Negative for large vessel occlusion. 2. Positive for bulky soft plaque at the Right ICA bulb with high-grade stenosis numerically estimated at 70%. Superimposed mild to moderate distal right ICA supraclinoid stenosis due to calcified plaque. 3. Moderate to severe stenosis at the Left ACA origin. 4. No other hemodynamically significant large vessel stenosis. 5. Left basal ganglia lacunar infarct with no hemorrhage or mass effect may be acute to subacute. 6. No new intracranial abnormality. 7. Bilateral paranasal sinusitis. 8. Aortic Atherosclerosis (ICD10-I70.0). Electronically Signed   By: Genevie Ann M.D.   On: 07/06/2019 17:00   MR BRAIN WO CONTRAST  Result Date: 07/07/2019 CLINICAL DATA:  Slurred speech, word-finding difficulty and right facial droop. EXAM: MRI HEAD WITHOUT CONTRAST TECHNIQUE: Multiplanar, multiecho pulse sequences of the brain and surrounding structures were obtained without intravenous contrast. COMPARISON:  Head CT July 04, 2018 FINDINGS: Brain: Small area of restricted diffusion in the left basal ganglia, involving the body of the caudate extending to the posterior lentiform nucleus, corresponding to hypodense area on recent head CT. No other focus of restricted diffusion or hemorrhage. Scattered foci of T2 hyperintensity are seen within the white matter of the cerebral hemispheres and within the pons, nonspecific, most likely related to chronic small vessel ischemia. Vascular: Normal flow voids. Skull and upper cervical spine: Normal marrow signal. Sinuses/Orbits: Obliteration of  the sphenoid sinuses with mucosal thickening and T1 hyperintense/T2 hypointense content, likely inspissated secretion. There is also mucosal thickening of the frontal and maxillary sinuses and ethmoid cells. The orbits are preserved. Other: Bilateral mastoid effusions. IMPRESSION: 1. Small area of restricted diffusion in the left basal ganglia, involving the body of the caudate extending to the posterior lentiform nucleus, corresponding to hypodense area on recent head CT. Findings are consistent with acute to subacute infarct. 2. Scattered foci of T2 hyperintensity within the white matter of the cerebral hemispheres and pons, nonspecific, but most likely related to chronic small vessel ischemia. Electronically Signed   By: Pedro Earls M.D.   On: 07/07/2019 09:31   US Carotid Duplex Bilateral  Result Date: 07/07/2019 CLINICAL DATA:  Visual numbness and speech loss. EXAM: BILATERAL CAROTID DUPLEX ULTRASOUND TECHNIQUE: Pearline Cables scale imaging, color Doppler and duplex ultrasound were performed of bilateral carotid and vertebral arteries in the neck. COMPARISON:  None. FINDINGS: Criteria: Quantification of carotid stenosis is based on velocity parameters that correlate the residual internal carotid diameter with NASCET-based stenosis levels, using the diameter of the distal internal carotid lumen as the denominator for stenosis measurement. The following velocity measurements were obtained: RIGHT ICA:  266/59 cm/sec CCA:  25/42 cm/sec SYSTOLIC ICA/CCA RATIO:  3.7 ECA:  102 cm/sec LEFT ICA:  81/19 cm/sec CCA:  70/62 cm/sec SYSTOLIC ICA/CCA RATIO:  0.9 ECA:  237 cm/sec RIGHT CAROTID ARTERY: Moderate calcified plaque  is present at the level of the carotid bulb and proximal right ICA. Velocity elevation corresponds to an estimated greater than 70% proximal right ICA stenosis. RIGHT VERTEBRAL ARTERY: Antegrade flow with normal waveform and velocity. LEFT CAROTID ARTERY: Moderate partially calcified plaque  at the level of the carotid bulb and proximal left ICA. Estimated left ICA stenosis is less than 50%. LEFT VERTEBRAL ARTERY: Antegrade flow with normal waveform and velocity. IMPRESSION: Significant plaque at the level of both carotid bulbs and proximal internal carotid arteries, right greater than left. Estimated proximal right ICA stenosis is greater than 70%. Estimated proximal left ICA stenosis is less than 50%. Electronically Signed   By: Aletta Edouard M.D.   On: 07/07/2019 07:56   ECHOCARDIOGRAM COMPLETE  Result Date: 07/07/2019    ECHOCARDIOGRAM REPORT   Patient Name:   SANDRA TELLEFSEN Date of Exam: 07/07/2019 Medical Rec #:  [id number]        Height:       74.0 in Accession #:    [id number]       Weight:       215.0 lb Date of Birth:  11/23/1950        BSA:          2.241 m Patient Age:    60 years         BP:           172/100 mmHg Patient Gender: M                HR:           67 bpm. Exam Location:  Inpatient Procedure: 2D Echo, Cardiac Doppler and Color Doppler Indications:    Stroke.  History:        Patient has no prior history of Echocardiogram examinations.                 Stroke; Risk Factors:Hypertension, Diabetes, Dyslipidemia and                 Current Smoker.  Sonographer:    Roseanna Rainbow RDCS Referring Phys: [id number] Leanne Chang [facility]  Sonographer Comments: Interrupted 3 times. IMPRESSIONS  1. Left ventricular ejection fraction, by estimation, is 60 to 65%. The left ventricle has normal function. The left ventricle has no regional wall motion abnormalities. There is moderate concentric left ventricular hypertrophy. Left ventricular diastolic parameters are consistent with Grade I diastolic dysfunction (impaired relaxation).  2. Right ventricular systolic function is normal. The right ventricular size is normal. Tricuspid regurgitation signal is inadequate for assessing PA pressure.  3. Left atrial size was mildly dilated.  4. The mitral valve is grossly normal. Mild mitral valve  regurgitation. No evidence of mitral stenosis.  5. The aortic valve is tricuspid. Aortic valve regurgitation is not visualized. Mild aortic valve sclerosis is present, with no evidence of aortic valve stenosis.  6. 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  7. The inferior vena cava is dilated in size with >50% respiratory variability, suggesting right atrial pressure of 8 mmHg. Conclusion(s)/Recommendation(s): No intracardiac source of embolism detected on this transthoracic study. A transesophageal echocardiogram is recommended to exclude cardiac source of embolism if clinically indicated. FINDINGS  Left Ventricle: Left ventricular ejection fraction, by estimation, is 60 to 65%. The left ventricle has normal function. The left ventricle has no regional wall motion abnormalities. The left ventricular internal cavity size was normal in size. There is  moderate concentric left ventricular hypertrophy. Left ventricular  diastolic parameters are consistent with Grade I diastolic dysfunction (impaired relaxation). Normal left ventricular filling pressure. Right Ventricle: The right ventricular size is normal. No increase in right ventricular wall thickness. Right ventricular systolic function is normal. Tricuspid regurgitation signal is inadequate for assessing PA pressure. Left Atrium: Left atrial size was mildly dilated. Right Atrium: Right atrial size was normal in size. Pericardium: There is no evidence of pericardial effusion. Presence of pericardial fat pad. Mitral Valve: The mitral valve is grossly normal. Mild mitral annular calcification. Mild mitral valve regurgitation. No evidence of mitral valve stenosis. Tricuspid Valve: The tricuspid valve is grossly normal. Tricuspid valve regurgitation is not demonstrated. No evidence of tricuspid stenosis. Aortic Valve: The aortic valve is tricuspid. Aortic valve regurgitation is not visualized. Mild aortic valve sclerosis is present, with no  evidence of aortic valve stenosis. Pulmonic Valve: The pulmonic valve was grossly normal. Pulmonic valve regurgitation is not visualized. No evidence of pulmonic stenosis. Aorta: The aortic root is normal in size and structure. 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 Venous: The inferior vena cava is dilated in size with greater than 50% respiratory variability, suggesting right atrial pressure of 8 mmHg. IAS/Shunts: The interatrial septum appears to be lipomatous. The atrial septum is grossly normal.  LEFT VENTRICLE PLAX 2D LVIDd:         4.86 cm      Diastology LVIDs:         2.96 cm      LV e' lateral:   3.92 cm/s LV PW:         1.46 cm      LV E/e' lateral: 13.7 LV IVS:        1.40 cm      LV e' medial:    4.03 cm/s LVOT diam:     2.20 cm      LV E/e' medial:  13.3 LV SV:         87 LV SV Index:   39 LVOT Area:     3.80 cm  LV Volumes (MOD) LV vol d, MOD A2C: 143.0 ml LV vol s, MOD A2C: 60.9 ml LV vol s, MOD A4C: 48.3 ml LV SV MOD A2C:     82.1 ml RIGHT VENTRICLE             IVC RV S prime:     12.80 cm/s  IVC diam: 2.73 cm TAPSE (M-mode): 2.6 cm LEFT ATRIUM             Index       RIGHT ATRIUM           Index LA diam:        4.60 cm 2.05 cm/m  RA Area:     20.20 cm LA Vol (A2C):   98.4 ml 43.90 ml/m RA Volume:   51.80 ml  23.11 ml/m LA Vol (A4C):   80.4 ml 35.87 ml/m LA Biplane Vol: 90.1 ml 40.20 ml/m  AORTIC VALVE LVOT Vmax:   106.00 cm/s LVOT Vmean:  72.700 cm/s LVOT VTI:    0.228 m  AORTA Ao Root diam: 3.70 cm Ao Asc diam:  4.00 cm MITRAL VALVE MV Area (PHT): 4.15 cm     SHUNTS MV Decel Time: 183 msec     Systemic VTI:  0.23 m MV E velocity: 53.60 cm/s   Systemic Diam: 2.20 cm MV A velocity: 115.00 cm/s MV E/A ratio:  0.47 Eleonore Chiquito MD Electronically signed by Eleonore Chiquito  MD Signature Date/Time: 07/07/2019/2:15:04 PM    Final    VAS US CAROTID  Result Date: 07/07/2019 Carotid Arterial Duplex Study Indications:       Stenosis seen on CTA. Comparison Study:  07/06/2019  Performing Technologist: Maudry Mayhew MHA, RDMS, RVT, RDCS  Examination Guidelines: A complete evaluation includes B-mode imaging, spectral Doppler, color Doppler, and power Doppler as needed of all accessible portions of each vessel. Bilateral testing is considered an integral part of a complete examination. Limited examinations for reoccurring indications may be performed as noted.  Right Carotid Findings: +----------+--------+--------+--------+--------------------------+--------+           PSV cm/sEDV cm/sStenosisPlaque Description        Comments +----------+--------+--------+--------+--------------------------+--------+ CCA Prox  76      11                                                 +----------+--------+--------+--------+--------------------------+--------+ CCA Distal59      13              smooth and heterogenous            +----------+--------+--------+--------+--------------------------+--------+ ICA Prox  259     73      60-79%  irregular and heterogenous         +----------+--------+--------+--------+--------------------------+--------+ ICA Distal135     37                                                 +----------+--------+--------+--------+--------------------------+--------+ ECA       91      16                                                 +----------+--------+--------+--------+--------------------------+--------+ +----------+--------+-------+----------------+-------------------+           PSV cm/sEDV cmsDescribe        Arm Pressure (mmHG) +----------+--------+-------+----------------+-------------------+ [id number]             Multiphasic, WNL                    +----------+--------+-------+----------------+-------------------+ +---------+--------+--+--------+-+---------+ VertebralPSV cm/s50EDV cm/s9Antegrade +---------+--------+--+--------+-+---------+  Left Carotid Findings:  +----------+-------+-------+--------+---------------------------------+--------+           PSV    EDV    StenosisPlaque Description               Comments           cm/s   cm/s                                                     +----------+-------+-------+--------+---------------------------------+--------+ CCA Prox  75     11                                                       +----------+-------+-------+--------+---------------------------------+--------+  CCA Distal59     13             smooth and heterogenous                   +----------+-------+-------+--------+---------------------------------+--------+ ICA Prox  49     13             heterogenous, irregular and                                               calcific                                  +----------+-------+-------+--------+---------------------------------+--------+ ICA Distal87     26                                                       +----------+-------+-------+--------+---------------------------------+--------+ ECA       158    16             heterogenous and smooth                   +----------+-------+-------+--------+---------------------------------+--------+ +----------+--------+--------+----------------+-------------------+           PSV cm/sEDV cm/sDescribe        Arm Pressure (mmHG) +----------+--------+--------+----------------+-------------------+ [id number]              Multiphasic, WNL                    +----------+--------+--------+----------------+-------------------+ +---------+--------+--+--------+--+---------+ VertebralPSV cm/s52EDV cm/s13Antegrade +---------+--------+--+--------+--+---------+   Summary: Right Carotid: Velocities in the right ICA are consistent with a 60-79%                stenosis. Left Carotid: Velocities in the left ICA are consistent with a 1-39% stenosis. Vertebrals:  Bilateral vertebral arteries demonstrate antegrade flow.  Subclavians: Normal flow hemodynamics were seen in bilateral subclavian              arteries. *See table(s) above for measurements and observations.  Electronically signed by Deitra Mayo MD on 07/07/2019 at 4:49:31 PM.    Final        Subjective: No new complaints.   Discharge Exam: Vitals:   07/08/19 1009 07/08/19 1330  BP: (!) 199/94 (!) 189/113  Pulse:    Resp:    Temp:    SpO2: 98%    Vitals:   07/07/19 2320 07/08/19 0316 07/08/19 1009 07/08/19 1330  BP: (!) 164/96 (!) 159/92 (!) 199/94 (!) 189/113  Pulse: 79 60    Resp: 18 18    Temp: 98 F (36.7 C) 97.9 F (36.6 C)    TempSrc: Oral Oral    SpO2: 100% 100% 98%   Weight:      Height:        General: Pt is alert, awake, not in acute distress Cardiovascular: RRR, S1/S2 +, no rubs, no gallops Respiratory: CTA bilaterally, no wheezing, no rhonchi Abdominal: Soft, NT, ND, bowel sounds + Extremities: no edema, no cyanosis    The results of significant diagnostics from this hospitalization (including imaging, microbiology, ancillary and laboratory) are listed below for reference.     Microbiology:  Recent Results (from the past 240 hour(s))  SARS CORONAVIRUS 2 (TAT 6-24 HRS) Nasopharyngeal Nasopharyngeal Swab     Status: None   Collection Time: 07/06/19  6:50 PM   Specimen: Nasopharyngeal Swab  Result Value Ref Range Status   SARS Coronavirus 2 NEGATIVE NEGATIVE Final    Comment: (NOTE) SARS-CoV-2 target nucleic acids are NOT DETECTED. The SARS-CoV-2 RNA is generally detectable in upper and lower respiratory specimens during the acute phase of infection. Negative results do not preclude SARS-CoV-2 infection, do not rule out co-infections with other pathogens, and should not be used as the sole basis for treatment or other patient management decisions. Negative results must be combined with clinical observations, patient history, and epidemiological information. The expected result is Negative. Fact  Sheet for Patients: SugarRoll.be Fact Sheet for Healthcare Providers: https://www.woods-mathews.com/ This test is not yet approved or cleared by the [place] FDA and  has been authorized for detection and/or diagnosis of SARS-CoV-2 by FDA under an Emergency Use Authorization (EUA). This EUA will remain  in effect (meaning this test can be used) for the duration of the COVID-19 declaration under Section 56 4(b)(1) of the Act, 21 U.S.C. section 360bbb-3(b)(1), unless the authorization is terminated or revoked sooner. Performed at [facility], [place] [address], [place], [place] [address]      Labs: BNP (last 3 results) No results for input(s): BNP in the last 8760 hours. Basic Metabolic Panel: Recent Labs  Lab 07/06/19 1200  NA 138  K 3.7  CL 102  CO2 28  GLUCOSE 189*  BUN 13  CREATININE 0.68  CALCIUM 8.9   Liver Function Tests: Recent Labs  Lab 07/06/19 1200  AST 19  ALT 24  ALKPHOS 88  BILITOT 0.6  PROT 7.2  ALBUMIN 3.9   No results for input(s): LIPASE, AMYLASE in the last 168 hours. No results for input(s): AMMONIA in the last 168 hours. CBC: Recent Labs  Lab 07/06/19 1200  WBC 8.0  NEUTROABS 4.9  HGB 16.1  HCT 47.5  MCV 94.1  PLT 248   Cardiac Enzymes: No results for input(s): CKTOTAL, CKMB, CKMBINDEX, TROPONINI in the last 168 hours. BNP: Invalid input(s): POCBNP CBG: Recent Labs  Lab 07/07/19 1143 07/07/19 1701 07/07/19 2101 07/08/19 0601 07/08/19 1140  GLUCAP 240* 156* 256* 210* 281*   D-Dimer No results for input(s): DDIMER in the last 72 hours. Hgb A1c No results for input(s): HGBA1C in the last 72 hours. Lipid Profile No results for input(s): CHOL, HDL, LDLCALC, TRIG, CHOLHDL, LDLDIRECT in the last 72 hours. Thyroid function studies No results for input(s): TSH, T4TOTAL, T3FREE, THYROIDAB in the last 72 hours.  Invalid input(s): FREET3 Anemia work up No results for input(s):  VITAMINB12, FOLATE, FERRITIN, TIBC, IRON, RETICCTPCT in the last 72 hours. Urinalysis No results found for: COLORURINE, APPEARANCEUR, [place], [place], [place], [place], [place], [place], PROTEINUR, UROBILINOGEN, NITRITE, LEUKOCYTESUR Sepsis Labs Invalid input(s): PROCALCITONIN,  WBC,  LACTICIDVEN Microbiology Recent Results (from the past 240 hour(s))  SARS CORONAVIRUS 2 (TAT 6-24 HRS) Nasopharyngeal Nasopharyngeal Swab     Status: None   Collection Time: 07/06/19  6:50 PM   Specimen: Nasopharyngeal Swab  Result Value Ref Range Status   SARS Coronavirus 2 NEGATIVE NEGATIVE Final    Comment: (NOTE) SARS-CoV-2 target nucleic acids are NOT DETECTED. The SARS-CoV-2 RNA is generally detectable in upper and lower respiratory specimens during the acute phase of infection. Negative results do not preclude SARS-CoV-2 infection, do not rule out co-infections with other  pathogens, and should not be used as the sole basis for treatment or other patient management decisions. Negative results must be combined with clinical observations, patient history, and epidemiological information. The expected result is Negative. Fact Sheet for Patients: SugarRoll.be Fact Sheet for Healthcare Providers: https://www.woods-mathews.com/ This test is not yet approved or cleared by the [place] FDA and  has been authorized for detection and/or diagnosis of SARS-CoV-2 by FDA under an Emergency Use Authorization (EUA). This EUA will remain  in effect (meaning this test can be used) for the duration of the COVID-19 declaration under Section 56 4(b)(1) of the Act, 21 U.S.C. section 360bbb-3(b)(1), unless the authorization is terminated or revoked sooner. Performed at [facility], [place] [address] [place], [place] [address]      Time coordinating discharge: 34 minutes  SIGNED:   Hosie Poisson, MD  [facility]

## 2019-09-15 ENCOUNTER — Other Ambulatory Visit: Payer: Self-pay | Admitting: *Deleted

## 2019-09-15 DIAGNOSIS — R0989 Other specified symptoms and signs involving the circulatory and respiratory systems: Secondary | ICD-10-CM

## 2019-09-18 ENCOUNTER — Telehealth (HOSPITAL_COMMUNITY): Payer: Self-pay

## 2019-09-18 NOTE — Telephone Encounter (Signed)

## 2019-09-19 ENCOUNTER — Other Ambulatory Visit: Payer: Self-pay

## 2019-09-19 ENCOUNTER — Ambulatory Visit (INDEPENDENT_AMBULATORY_CARE_PROVIDER_SITE_OTHER): Payer: Medicare Other | Admitting: Vascular Surgery

## 2019-09-19 ENCOUNTER — Ambulatory Visit (HOSPITAL_COMMUNITY)
Admission: RE | Admit: 2019-09-19 | Discharge: 2019-09-19 | Disposition: A | Discharge status: Home / Self Care | Payer: Medicare Other | Source: Ambulatory Visit | Attending: Vascular Surgery | Admitting: Vascular Surgery

## 2019-09-19 ENCOUNTER — Encounter: Payer: Self-pay | Admitting: Vascular Surgery

## 2019-09-19 VITALS — BP 160/79 | HR 69 | Temp 97.7°F | Resp 20 | Ht 74.0 in | Wt 210.0 lb

## 2019-09-19 DIAGNOSIS — I6521 Occlusion and stenosis of right carotid artery: Secondary | ICD-10-CM | POA: Diagnosis not present

## 2019-09-19 DIAGNOSIS — R0989 Other specified symptoms and signs involving the circulatory and respiratory systems: Secondary | ICD-10-CM

## 2019-09-19 NOTE — Progress Notes (Signed)
Vascular and Vein Specialist of [facility]  Patient name: Samuel Bradshaw MRN: [id number] DOB: 1950-09-12 Sex: male  REASON FOR CONSULT: Evaluation carotid stenosis  HPI: Samuel Bradshaw is a 69 y.o. male, who is here today for evaluation.  He is here with his wife.  In March 2021 he had an episode with aphasia.  He had a work-up to include MRI of his brain showing a small left brain stroke and CT angiogram of his neck and head.  Did have evidence of a left brain stroke and his CT showed minimal disease in his left carotid artery.  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  He is right-handed.  He specifically denies any prior visual changes and specifically denies any left sided weakness.  He has been on aspirin and Plavix since this event.  He has had no new events.  He and his wife both report that he continues to have some word searching especially when he is fatigued.  Past Medical History:  Diagnosis Date  . Diabetes mellitus without complication ([place])   . Hyperlipidemia   . Hypertension     History reviewed. No pertinent family history.  SOCIAL HISTORY: Social History   Socioeconomic History  . Marital status: Married    Spouse name: Not on file  . Number of children: Not on file  . Years of education: Not on file  . Highest education level: Not on file  Occupational History  . Not on file  Tobacco Use  . Smoking status: Former Smoker    Packs/day: 1.00    Years: 25.00    Pack years: 25.00    Types: Cigarettes    Quit date: 05/15/2019    Years since quitting: 0.3  . Smokeless tobacco: Never Used  Substance and Sexual Activity  . Alcohol use: Yes    Alcohol/week: 6.0 standard drinks    Types: 6 Cans of beer per week    Comment: 6 beers a week  . Drug use: Never  . Sexual activity: Not on file  Other Topics Concern  . Not on file  Social History Narrative  .  Not on file   Social Determinants of Health   Financial Resource Strain:   . Difficulty of Paying Living Expenses:   Food Insecurity:   . Worried About Charity fundraiser in the Last Year:   . Arboriculturist in the Last Year:   Transportation Needs:   . Film/video editor (Medical):   Marland Kitchen Lack of Transportation (Non-Medical):   Physical Activity:   . Days of Exercise per Week:   . Minutes of Exercise per Session:   Stress:   . Feeling of Stress :   Social Connections:   . Frequency of Communication with Friends and Family:   . Frequency of Social Gatherings with Friends and Family:   . Attends Religious Services:   . Active Member of Clubs or Organizations:   . Attends Archivist Meetings:   Marland Kitchen Marital Status:   Intimate Partner Violence:   . Fear of Current or Ex-Partner:   . Emotionally Abused:   Marland Kitchen Physically Abused:   . Sexually Abused:     Allergies  Allergen Reactions  . Bee Venom     Current Outpatient Medications  Medication Sig Dispense Refill  . amLODipine (NORVASC) 10 MG tablet Take 1 tablet (10 mg total) by mouth daily.  30 tablet 1  . aspirin EC 81 MG EC tablet Take 1 tablet (81 mg total) by mouth daily. 30 tablet 1  . atorvastatin (LIPITOR) 80 MG tablet Take 1 tablet (80 mg total) by mouth daily at 6 PM. 30 tablet 1  . blood glucose meter kit and supplies KIT Dispense based on patient and insurance preference. Use up to four times daily as directed. (FOR ICD-9 250.00, 250.01). 1 each 0  . cloNIDine (CATAPRES) 0.1 MG tablet Take 0.1 mg by mouth daily.    . clopidogrel (PLAVIX) 75 MG tablet Take 75 mg by mouth daily.    . feeding supplement, GLUCERNA SHAKE, (GLUCERNA SHAKE) LIQD Take 237 mLs by mouth 3 (three) times daily between meals.  0  . hydrochlorothiazide (HYDRODIURIL) 12.5 MG tablet Take 12.5 mg by mouth daily.    Marland Kitchen losartan (COZAAR) 100 MG tablet Take 100 mg by mouth every morning.    . metFORMIN (GLUCOPHAGE) 500 MG tablet Take 500 mg by  mouth 2 (two) times daily.    . Multiple Vitamin (MULTIVITAMIN WITH MINERALS) TABS tablet Take 1 tablet by mouth daily.    Marland Kitchen senna-docusate (SENOKOT-S) 8.6-50 MG tablet Take 1 tablet by mouth at bedtime as needed for mild constipation or moderate constipation.    . insulin aspart (NOVOLOG) 100 UNIT/ML injection CBG 121 - 150: 1 unit CBG 151 - 200: 2 units CBG 201 - 250: 3 units CBG 251 - 300: 5 units CBG 301 - 350: 7 units CBG 351 - 400: 9 units (Patient not taking: Reported on 09/19/2019) 10 mL 11  . insulin glargine (LANTUS) 100 UNIT/ML injection Inject 0.1 mLs (10 Units total) into the skin at bedtime. (Patient not taking: Reported on 09/19/2019) 10 mL 11   No current facility-administered medications for this visit.    REVIEW OF SYSTEMS:  [X]  denotes positive finding, [ ]  denotes negative finding Cardiac  Comments:  Chest pain or chest pressure:    Shortness of breath upon exertion:    Short of breath when lying flat:    Irregular heart rhythm:        Vascular    Pain in calf, thigh, or hip brought on by ambulation:    Pain in feet at night that wakes you up from your sleep:     Blood clot in your veins:    Leg swelling:         Pulmonary    Oxygen at home:    Productive cough:     Wheezing:         Neurologic    Sudden weakness in arms or legs:     Sudden numbness in arms or legs:     Sudden onset of difficulty speaking or slurred speech: x   Temporary loss of vision in one eye:     Problems with dizziness:         Gastrointestinal    Blood in stool:     Vomited blood:         Genitourinary    Burning when urinating:     Blood in urine:        Psychiatric    Major depression:         Hematologic    Bleeding problems:    Problems with blood clotting too easily:        Skin    Rashes or ulcers:        Constitutional    Fever or chills:  PHYSICAL EXAM: Vitals:   09/19/19 0927 09/19/19 0929  BP: (!) 160/90 (!) 160/79  Pulse: 69   Resp: 20   Temp:  97.7 F (36.5 C)   SpO2: 98%   Weight: 210 lb (95.3 kg)   Height: 6' 2"  (1.88 m)     GENERAL: The patient is a well-nourished male, in no acute distress. The vital signs are documented above. CARDIOVASCULAR: I do not hear carotid bruits bilaterally.  Radial and dorsalis pedis pulses are 2+ bilaterally PULMONARY: There is good air exchange  ABDOMEN: Soft and non-tender  MUSCULOSKELETAL: There are no major deformities or cyanosis. NEUROLOGIC: No focal weakness or paresthesias are detected. SKIN: There are no ulcers or rashes noted. PSYCHIATRIC: The patient has a normal affect.  DATA:  Reviewed his CT and his duplex.  He had a repeat duplex today and this does show same level of stenosis with a 50 to 79% right carotid stenosis and 1 to 39% left carotid stenosis  MEDICAL ISSUES: Long discussion with the patient and his wife.  Explained that he is below our typical threshold for recommendation of endarterectomy for asymptomatic disease.  He will present to the emergency room immediately should he have any new neurologic deficits.  We will see him again in 6 months in our [place] office with repeat carotid duplex at that time.   Rosetta Posner, MD FACS [facility] and Vein Specialists of [facility] Tel [phone] Pager [phone]

## 2020-12-27 ENCOUNTER — Other Ambulatory Visit (HOSPITAL_COMMUNITY): Payer: Self-pay

## 2021-07-27 IMAGING — CT CT HEAD W/O CM
3 series · 15 of 47 positions shown, 18 images · non-contrast
Comparison: None.

CLINICAL DATA: Woke up [REDACTED] morning with slurred speech. Still
slightly slurred speech.

EXAM:
CT HEAD WITHOUT CONTRAST
TECHNIQUE: Contiguous axial images were obtained from the base of the skull
through the vertex without intravenous contrast.

[Series 2: head w o · axial · 0.44mm/px · z∈[+55,+185]mm · 9 of 32 slices shown, 12 images]
[im 3/32  brain]
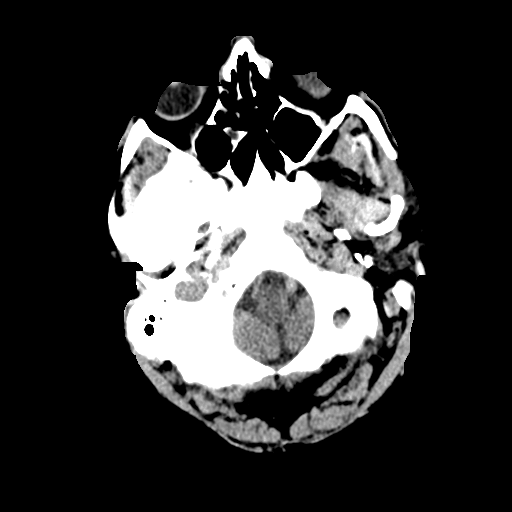
[im 3/32  bone]
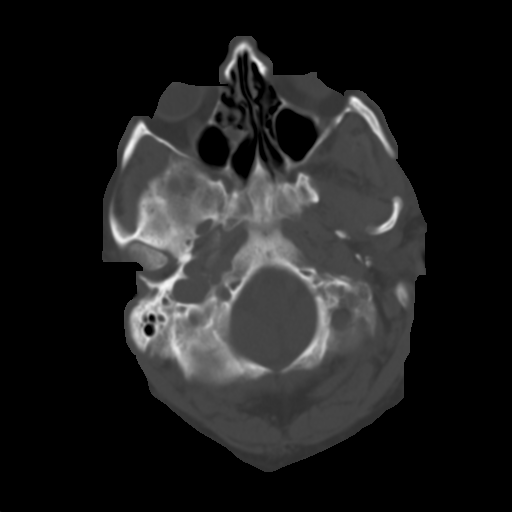
[im 6/32  brain]
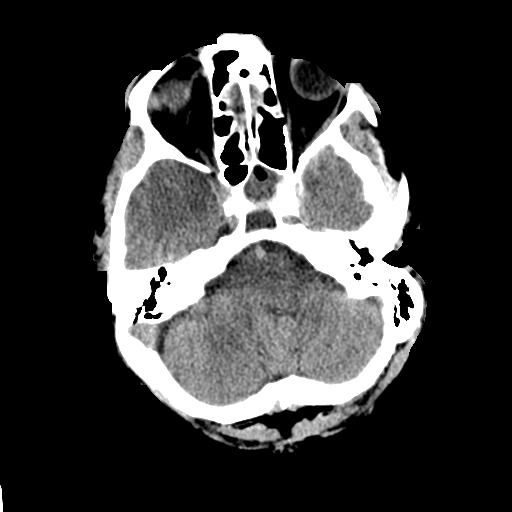
[im 9/32  brain]
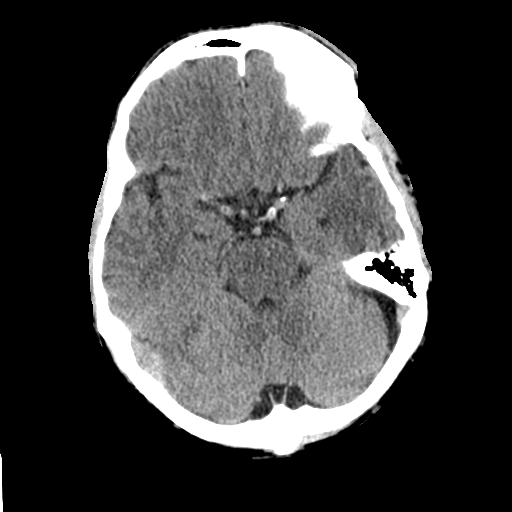
[im 12/32  brain]
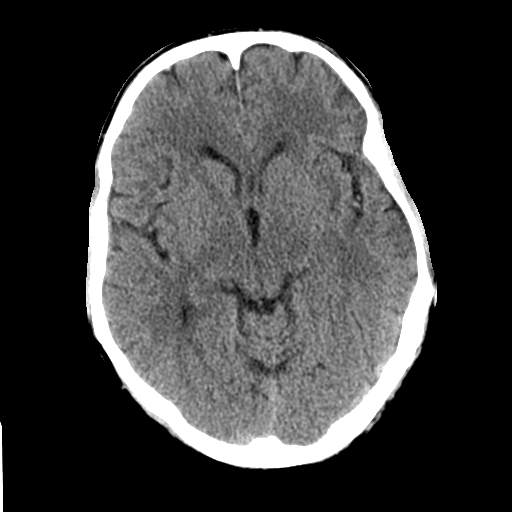
[im 17/32  brain]
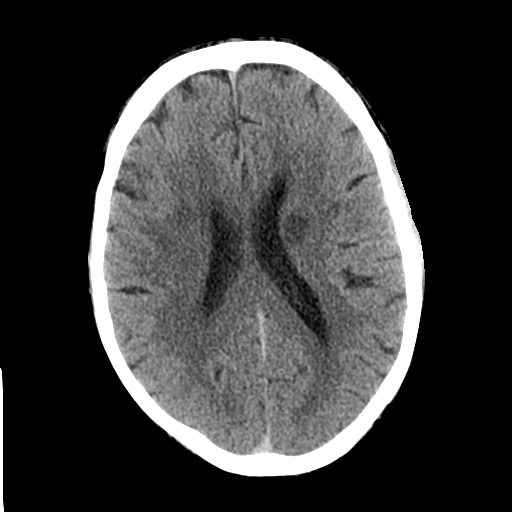
[im 17/32  bone]
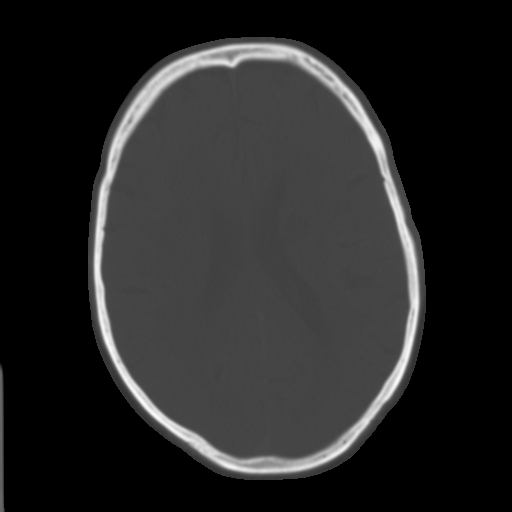
[im 20/32  brain]
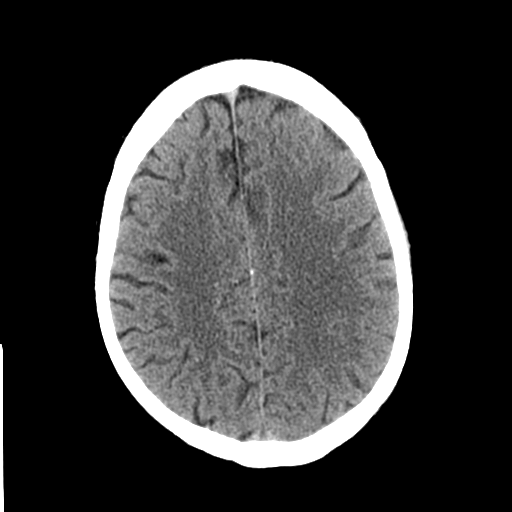
[im 23/32  brain]
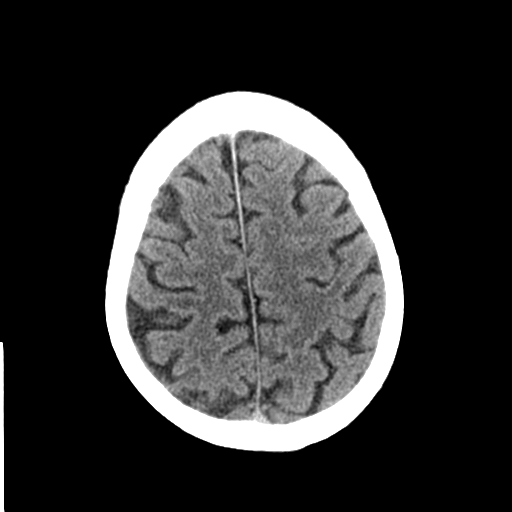
[im 26/32  brain]
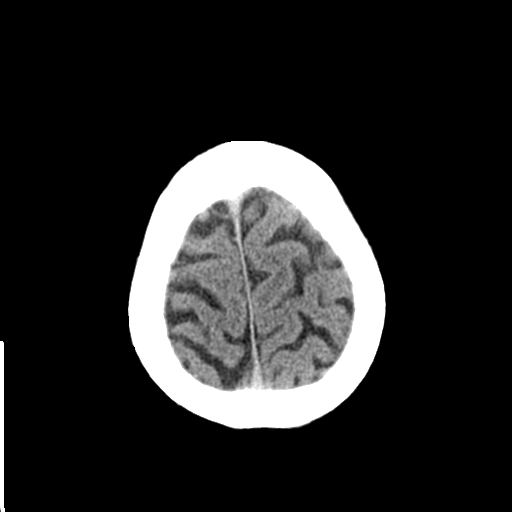
[im 29/32  brain]
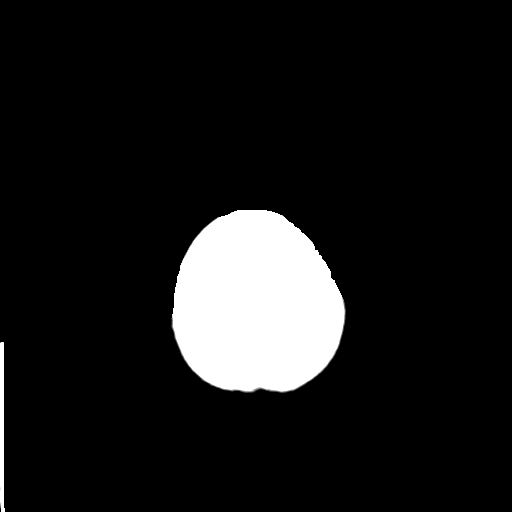
[im 29/32  bone]
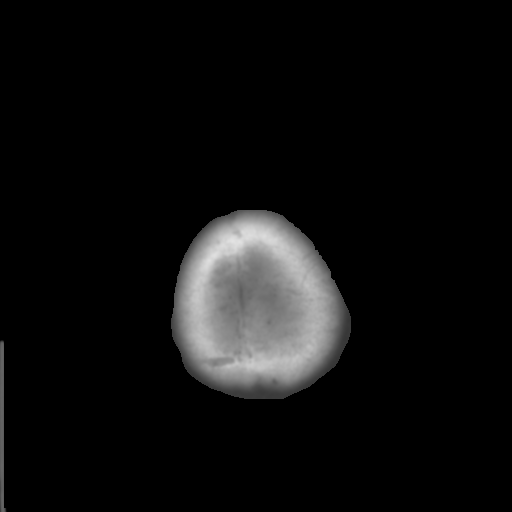

[Series 4: coronal soft · coronal · 0.33mm/px · 3 of 77 slices shown]
[im 26/77  brain]
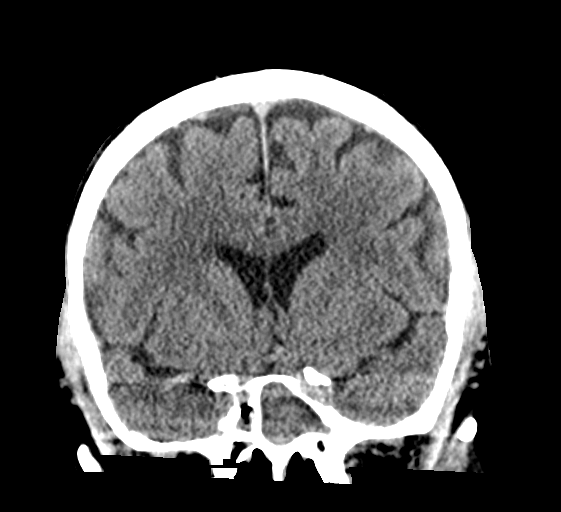
[im 34/77  brain]
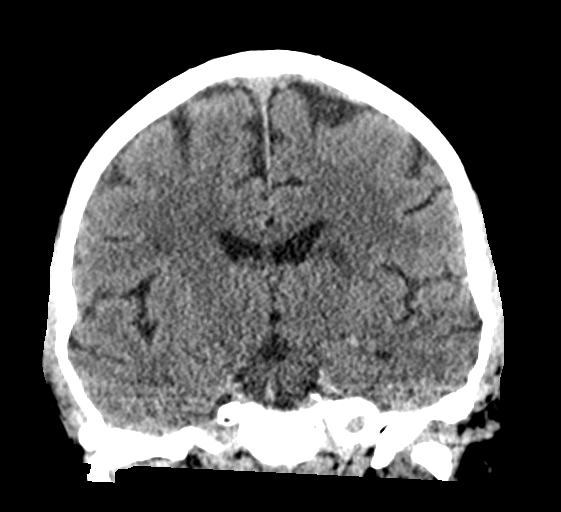
[im 43/77  brain]
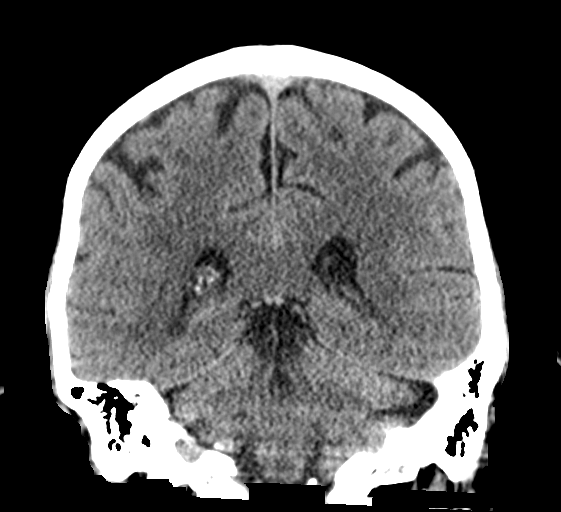

[Series 5: sagittal soft · sagittal · 0.34mm/px · 3 of 57 slices shown]
[im 19/57  brain]
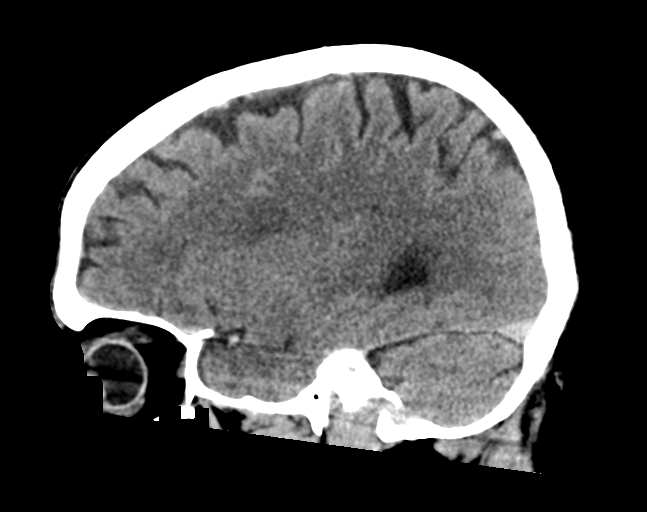
[im 29/57  brain]
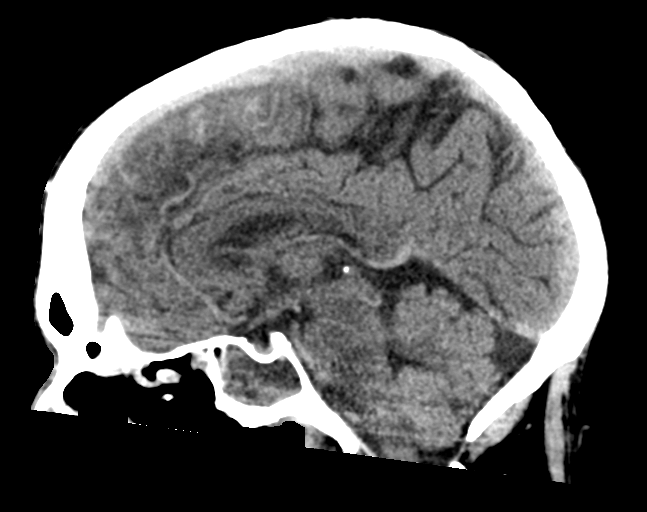
[im 38/57  brain]
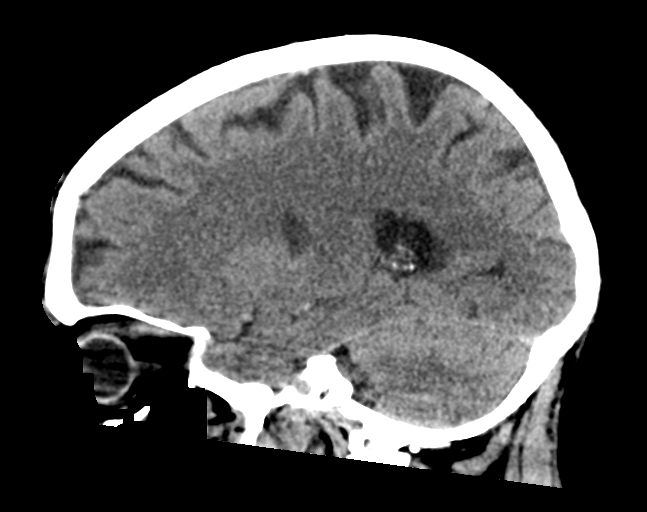

[15 of 47 positions shown; findings below may reference images not displayed]

FINDINGS: Brain: The ventricles are in the midline without mass effect or
shift. No extra-axial fluid collections identified. Patchy white
matter changes, likely microvascular ischemic change. There is a
slightly rounded area of low attenuation in the deep white
periventricular white matter suspicious for a acute or subacute
infarct. MR imaging is suggested for further evaluation.

No hemispheric infarction or intracranial hemorrhage. The brainstem
and cerebellum appear normal.

Vascular: Scattered vascular calcifications. No definite hyperdense
vessels or aneurysm.

Skull: No skull fracture or bone lesions.

Sinuses/Orbits: Near complete opacification of the sphenoid sinus
and fairly marked mucoperiosteal thickening involving the ethmoid
air cells and maxillary sinuses. Scattered mastoid effusions are
noted on the left side.

Other: No scalp lesions or hematoma.
IMPRESSION: 1. Suspect acute or subacute left periventricular white matter
infarct. Recommend MR imaging for further evaluation.
2. No findings for hemispheric infarction or intracranial
hemorrhage.
3. Paranasal sinus disease and left-sided mastoid effusions.

## 2021-12-10 ENCOUNTER — Telehealth: Payer: Self-pay | Admitting: *Deleted

## 2021-12-10 NOTE — Patient Outreach (Signed)
  Care Coordination   12/10/2021 Name: Samuel Bradshaw MRN: [id number] DOB: 12/14/1950   Care Coordination Outreach Attempts:  An unsuccessful telephone outreach was attempted today to offer the patient information about available care coordination services as a benefit of their health plan.   Spoke with patient's spouse who reports patient is not available, requests call back another day.  Follow Up Plan:  Additional outreach attempts will be made to offer the patient care coordination information and services.   Encounter Outcome:  Pt. Request to Call Back  Care Coordination Interventions Activated:  No   Care Coordination Interventions:  No, not indicated    Irving Shows [facility], BSN RN Care Coordinator [phone]

## 2021-12-11 ENCOUNTER — Telehealth: Payer: Self-pay | Admitting: *Deleted

## 2021-12-11 NOTE — Patient Outreach (Signed)
  Care Coordination   12/11/2021 Name: Samuel Bradshaw MRN: [id number] DOB: [birth year]   Care Coordination Outreach Attempts:  A second unsuccessful outreach was attempted today to offer the patient with information about available care coordination services as a benefit of their health plan.     Follow Up Plan:  Additional outreach attempts will be made to offer the patient care coordination information and services.   Encounter Outcome:  No Answer  Care Coordination Interventions Activated:  No   Care Coordination Interventions:  No, not indicated  {THN Tip this will not be part of the note when signed-REQUIRED REPORT FIELD DO NOT DELETE (Optional):[id number]  Irving Shows [facility], BSN [facility] RN Care Coordinator [phone]

## 2021-12-12 ENCOUNTER — Telehealth: Payer: Self-pay | Admitting: *Deleted

## 2021-12-12 NOTE — Patient Outreach (Signed)
  Care Coordination   Initial Visit Note   12/12/2021 Name: Samuel Bradshaw MRN: [id number] DOB: 09-06-1950  [name] is a 71 y.o. year old male who sees Patient, No Pcp Per for primary care. I spoke with  Noralee Chars by phone today.  What matters to the patients health and wellness today?  Patient refuses services    Goals Addressed   None     SDOH assessments and interventions completed:  No     Care Coordination Interventions Activated:  No  Care Coordination Interventions:  No, not indicated   Follow up plan: No further intervention required.   Encounter Outcome:  Pt. Refused   Irving Shows [facility], BSN [facility] RN Care Coordinator [phone]

## 2022-04-02 ENCOUNTER — Other Ambulatory Visit: Payer: Self-pay | Admitting: *Deleted

## 2022-04-02 DIAGNOSIS — I639 Cerebral infarction, unspecified: Secondary | ICD-10-CM

## 2022-04-02 DIAGNOSIS — Z87891 Personal history of nicotine dependence: Secondary | ICD-10-CM

## 2022-04-15 ENCOUNTER — Ambulatory Visit (INDEPENDENT_AMBULATORY_CARE_PROVIDER_SITE_OTHER): Payer: Medicare Other

## 2022-04-15 ENCOUNTER — Encounter: Payer: Self-pay | Admitting: Vascular Surgery

## 2022-04-15 ENCOUNTER — Ambulatory Visit (INDEPENDENT_AMBULATORY_CARE_PROVIDER_SITE_OTHER): Payer: Medicare Other | Admitting: Vascular Surgery

## 2022-04-15 VITALS — BP 134/84 | HR 73 | Temp 98.4°F | Ht 74.0 in | Wt 229.6 lb

## 2022-04-15 DIAGNOSIS — I639 Cerebral infarction, unspecified: Secondary | ICD-10-CM | POA: Diagnosis not present

## 2022-04-15 DIAGNOSIS — I6521 Occlusion and stenosis of right carotid artery: Secondary | ICD-10-CM

## 2022-04-15 NOTE — Progress Notes (Signed)
[facility] [place]  Patient name: Samuel Bradshaw MRN: [id number] DOB: 09/18/1950 Sex: male  REASON FOR VISIT: Follow-up right carotid stenosis  HPI: Samuel Bradshaw is a 72 y.o. male here today with his wife for follow-up.  He had a left brain stroke in 2021.  Workup at that time revealed no evidence of stenosis in his left internal carotid artery.  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  He is here today for follow-up of this.  He specifically denies any new neurologic deficits.  No amaurosis fugax, transient ischemic attack or stroke.  Past Medical History:  Diagnosis Date   Diabetes mellitus without complication ([place])    Hyperlipidemia    Hypertension     History reviewed. No pertinent family history.  SOCIAL HISTORY: Social History   Tobacco Use   Smoking status: Former    Packs/day: 1.00    Years: 25.00    Total pack years: 25.00    Types: Cigarettes    Quit date: 05/15/2019    Years since quitting: 2.9   Smokeless tobacco: Never  Substance Use Topics   Alcohol use: Yes    Alcohol/week: 6.0 standard drinks of alcohol    Types: 6 Cans of beer per week    Comment: 6 beers a week    Allergies  Allergen Reactions   Bee Venom     Current Outpatient Medications  Medication Sig Dispense Refill   amLODipine (NORVASC) 10 MG tablet Take 1 tablet (10 mg total) by mouth daily. 30 tablet 1   aspirin EC 81 MG EC tablet Take 1 tablet (81 mg total) by mouth daily. (Patient not taking: Reported on 04/15/2022) 30 tablet 1   atorvastatin (LIPITOR) 80 MG tablet Take 1 tablet (80 mg total) by mouth daily at 6 PM. 30 tablet 1   blood glucose meter kit and supplies KIT Dispense based on patient and insurance preference. Use up to four times daily as directed. (FOR ICD-9 250.00, 250.01). 1 each 0   cloNIDine (CATAPRES) 0.1 MG tablet Take 0.1 mg by mouth daily. (Patient not taking: Reported on 04/15/2022)      clopidogrel (PLAVIX) 75 MG tablet Take 75 mg by mouth daily.     feeding supplement, GLUCERNA SHAKE, (GLUCERNA SHAKE) LIQD Take 237 mLs by mouth 3 (three) times daily between meals. (Patient not taking: Reported on 04/15/2022)  0   hydrochlorothiazide (HYDRODIURIL) 12.5 MG tablet Take 12.5 mg by mouth daily. (Patient not taking: Reported on 04/15/2022)     insulin aspart (NOVOLOG) 100 UNIT/ML injection CBG 121 - 150: 1 unit CBG 151 - 200: 2 units CBG 201 - 250: 3 units CBG 251 - 300: 5 units CBG 301 - 350: 7 units CBG 351 - 400: 9 units (Patient not taking: Reported on 09/19/2019) 10 mL 11   insulin glargine (LANTUS) 100 UNIT/ML injection Inject 0.1 mLs (10 Units total) into the skin at bedtime. (Patient not taking: Reported on 09/19/2019) 10 mL 11   losartan (COZAAR) 100 MG tablet Take 100 mg by mouth every morning.     metFORMIN (GLUCOPHAGE) 500 MG tablet Take 500 mg by mouth 2 (two) times daily.     Multiple Vitamin (MULTIVITAMIN WITH MINERALS) TABS tablet Take 1 tablet by mouth daily.     senna-docusate (SENOKOT-S) 8.6-50 MG tablet Take 1 tablet by mouth at bedtime as needed for mild constipation or moderate constipation. (Patient not taking: Reported on 04/15/2022)  No current facility-administered medications for this visit.    REVIEW OF SYSTEMS:  _0  denotes positive finding, _1  denotes negative finding Cardiac  Comments:  Chest pain or chest pressure:    Shortness of breath upon exertion:    Short of breath when lying flat:    Irregular heart rhythm:        Vascular    Pain in calf, thigh, or hip brought on by ambulation:    Pain in feet at night that wakes you up from your sleep:     Blood clot in your veins:    Leg swelling:           PHYSICAL EXAM: Vitals:   04/15/22 0933 04/15/22 0934  BP: (!) 162/81 134/84  Pulse: 73   Temp: 98.4 F (36.9 C)   SpO2: 96%   Weight: 229 lb 9.6 oz (104.1 kg)   Height: _2  (1.88 m)     GENERAL: The patient is a well-nourished male,  in no acute distress. The vital signs are documented above. CARDIOVASCULAR: Carotid arteries without bruits bilaterally PULMONARY: There is good air exchange  MUSCULOSKELETAL: There are no major deformities or cyanosis. NEUROLOGIC: No focal weakness or paresthesias are detected. SKIN: There are no ulcers or rashes noted. PSYCHIATRIC: The patient has a normal affect.  DATA:  Carotid duplex today revealed no change from his prior study in 2021.  On the left he has a 1 to 39% stenosis and on the right 60 to 79% stenosis  MEDICAL ISSUES: Moderate to severe right internal carotid artery stenosis, asymptomatic.  He is right-handed.  I again reviewed symptoms of carotid disease with him.  He knows to present and only perform any neurologic change.  Otherwise we will see him in 1 year with repeat carotid duplex    Rosetta Posner, MD FACS [facility] [phone]  Note: Portions of this report may have been transcribed using voice recognition software.  Every effort has been made to ensure accuracy; however, inadvertent computerized transcription errors may still be present.

## 2023-07-26 ENCOUNTER — Ambulatory Visit
Admission: EM | Admit: 2023-07-26 | Discharge: 2023-07-26 | Disposition: A | Discharge status: Home / Self Care | Attending: Family Medicine | Admitting: Family Medicine

## 2023-07-26 DIAGNOSIS — S39012A Strain of muscle, fascia and tendon of lower back, initial encounter: Secondary | ICD-10-CM | POA: Diagnosis not present

## 2023-07-26 DIAGNOSIS — M5432 Sciatica, left side: Secondary | ICD-10-CM | POA: Diagnosis not present

## 2023-07-26 MED ORDER — TIZANIDINE HCL 2 MG PO CAPS: 2.0000 mg | ORAL_CAPSULE | Freq: Three times a day (TID) | ORAL | 0 refills | Status: AC | PRN

## 2023-07-26 MED ORDER — DEXAMETHASONE SODIUM PHOSPHATE 10 MG/ML IJ SOLN
10.0000 mg | Freq: Once | INTRAMUSCULAR | Status: AC
  Administered 2023-07-26: 10 mg via INTRAMUSCULAR

## 2023-07-26 NOTE — ED Triage Notes (Signed)
 Pt reports lower back pain on the left side radiating down into the left leg x 2 days worsened this morning around 3:15 am.

## 2023-07-26 NOTE — Discharge Instructions (Signed)
 We have given you a steroid shot today for pain and inflammation and I have prescribed a muscle relaxer for you to take as needed.  You may also use heat, Epsom salt baths, stretches, massage, Tylenol as needed.  Follow-up for worsening symptoms.

## 2023-07-26 NOTE — ED Provider Notes (Signed)
 [facility]    CSN: [id number] Arrival date & time: 07/26/23  0848      History   Chief Complaint No chief complaint on file.   HPI Samuel Bradshaw is a 73 y.o. male.   Presenting today with 1 day history of left lateral low back pain sometimes radiating down the back of the left leg.  Denies any known injury but states he was on the mower for several hours prior to onset of symptoms.  Denies fever, chills, urinary symptoms, numbness or tingling in the leg, bowel or bladder incontinence, saddle anesthesia.  So far tried Tylenol with mild temporary benefit.  No past history of similar.    Past Medical History:  Diagnosis Date   Diabetes mellitus without complication ([facility])    Hyperlipidemia    Hypertension     Patient Active Problem List   Diagnosis Date Noted   Acute CVA (cerebrovascular accident) ([facility]CC) 07/06/2019   Essential hypertension 07/06/2019   Dyslipidemia 07/06/2019   Diabetes mellitus ([facility]) 07/06/2019   Tobacco use 07/06/2019    Past Surgical History:  Procedure Laterality Date   FOOT SURGERY Right    HAND SURGERY Right    KNEE SURGERY Right        Home Medications    Prior to Admission medications   Medication Sig Start Date End Date Taking? Authorizing Provider  tizanidine (ZANAFLEX) 2 MG capsule Take 1 capsule (2 mg total) by mouth 3 (three) times daily as needed for muscle spasms. Do not drink alcohol or drive while taking this medication.  May cause drowsiness. 07/26/23  Yes Particia Nearing, PA-C  amLODipine (NORVASC) 10 MG tablet Take 1 tablet (10 mg total) by mouth daily. 07/09/19   Kathlen Mody, MD  aspirin EC 81 MG EC tablet Take 1 tablet (81 mg total) by mouth daily. Patient not taking: Reported on 04/15/2022 07/09/19   Kathlen Mody, MD  atorvastatin (LIPITOR) 80 MG tablet Take 1 tablet (80 mg total) by mouth daily at 6 PM. 07/08/19   Kathlen Mody, MD  blood glucose meter kit and supplies KIT Dispense based on patient and  insurance preference. Use up to four times daily as directed. (FOR ICD-9 250.00, 250.01). 07/08/19   Kathlen Mody, MD  cloNIDine (CATAPRES) 0.1 MG tablet Take 0.1 mg by mouth daily. Patient not taking: Reported on 04/15/2022 07/09/19   [provider]  clopidogrel (PLAVIX) 75 MG tablet Take 75 mg by mouth daily. 09/04/19   [provider]  feeding supplement, GLUCERNA SHAKE, (GLUCERNA SHAKE) LIQD Take 237 mLs by mouth 3 (three) times daily between meals. Patient not taking: Reported on 04/15/2022 07/08/19   Kathlen Mody, MD  hydrochlorothiazide (HYDRODIURIL) 12.5 MG tablet Take 12.5 mg by mouth daily. Patient not taking: Reported on 04/15/2022 09/04/19   [provider]  insulin aspart (NOVOLOG) 100 UNIT/ML injection CBG 121 - 150: 1 unit CBG 151 - 200: 2 units CBG 201 - 250: 3 units CBG 251 - 300: 5 units CBG 301 - 350: 7 units CBG 351 - 400: 9 units Patient not taking: Reported on 09/19/2019 07/08/19   Kathlen Mody, MD  insulin glargine (LANTUS) 100 UNIT/ML injection Inject 0.1 mLs (10 Units total) into the skin at bedtime. Patient not taking: Reported on 09/19/2019 07/08/19   Kathlen Mody, MD  losartan (COZAAR) 100 MG tablet Take 100 mg by mouth every morning. 09/04/19   [provider]  metFORMIN (GLUCOPHAGE) 500 MG tablet Take 500 mg by mouth 2 (  two) times daily. 09/07/19   [provider]  Multiple Vitamin (MULTIVITAMIN WITH MINERALS) TABS tablet Take 1 tablet by mouth daily. 07/09/19   Kathlen Mody, MD  senna-docusate (SENOKOT-S) 8.6-50 MG tablet Take 1 tablet by mouth at bedtime as needed for mild constipation or moderate constipation. Patient not taking: Reported on 04/15/2022 07/08/19   Kathlen Mody, MD    Family History History reviewed. No pertinent family history.  Social History Social History   Tobacco Use   Smoking status: Former    Current packs/day: 0.00    Average packs/day: 1 pack/day for 25.0 years (25.0 ttl pk-yrs)    Types:  Cigarettes    Start date: 05/14/1994    Quit date: 05/15/2019    Years since quitting: 4.2   Smokeless tobacco: Never  Vaping Use   Vaping status: Never Used  Substance Use Topics   Alcohol use: Yes    Alcohol/week: 6.0 standard drinks of alcohol    Types: 6 Cans of beer per week    Comment: 6 beers a week   Drug use: Never     Allergies   Bee venom   Review of Systems Review of Systems Per HPI  Physical Exam Triage Vital Signs ED Triage Vitals  Encounter Vitals Group     BP 07/26/23 0954 (!) 150/87     Systolic BP Percentile --      Diastolic BP Percentile --      Pulse Rate 07/26/23 0954 69     Resp 07/26/23 0954 16     Temp 07/26/23 0954 98.3 F (36.8 C)     Temp Source 07/26/23 0954 Oral     SpO2 07/26/23 0954 96 %     Weight --      Height --      Head Circumference --      Peak Flow --      Pain Score 07/26/23 0957 5     Pain Loc --      Pain Education --      Exclude from Growth Chart --    No data found.  Updated Vital Signs BP (!) 150/87 (BP Location: Right Arm)   Pulse 69   Temp 98.3 F (36.8 C) (Oral)   Resp 16   SpO2 96%   Visual Acuity Right Eye Distance:   Left Eye Distance:   Bilateral Distance:    Right Eye Near:   Left Eye Near:    Bilateral Near:     Physical Exam Vitals and nursing note reviewed.  Constitutional:      Appearance: Normal appearance.  HENT:     Head: Atraumatic.  Eyes:     Extraocular Movements: Extraocular movements intact.     Conjunctiva/sclera: Conjunctivae normal.  Cardiovascular:     Rate and Rhythm: Normal rate.  Pulmonary:     Effort: Pulmonary effort is normal.  Musculoskeletal:        General: Tenderness present. No swelling, deformity or signs of injury. Normal range of motion.     Cervical back: Normal range of motion and neck supple.     Comments: No midline spinal tenderness to palpation diffusely.  Negative straight leg raise bilateral lower extremities.  Normal gait and range of  motion  Localized tenderness to palpation to the left lateral lumbar musculature  Skin:    General: Skin is warm and dry.     Findings: No erythema.  Neurological:     General: No focal deficit present.  Mental Status: He is oriented to person, place, and time.     Motor: No weakness.     Gait: Gait normal.  Psychiatric:        Mood and Affect: Mood normal.        Thought Content: Thought content normal.        Judgment: Judgment normal.      UC Treatments / Results  Labs (all labs ordered are listed, but only abnormal results are displayed) Labs Reviewed - No data to display  EKG   Radiology No results found.  Procedures Procedures (including critical care time)  Medications Ordered in UC Medications  dexamethasone (DECADRON) injection 10 mg (10 mg Intramuscular Given 07/26/23 1019)    Initial Impression / Assessment and Plan / UC Course  I have reviewed the triage vital signs and the nursing notes.  Pertinent labs & imaging results that were available during my care of the patient were reviewed by me and considered in my medical decision making (see chart for details).     Exam very reassuring with no red flag findings today.  Will treat for muscular strain and sciatica with IM Decadron, Zanaflex, heat,/, stretches, rest.  Return for worsening symptoms.   Final Clinical Impressions(s) / UC Diagnoses   Final diagnoses:  Strain of lumbar region, initial encounter  Left sided sciatica     Discharge Instructions      We have given you a steroid shot today for pain and inflammation and I have prescribed a muscle relaxer for you to take as needed.  You may also use heat, Epsom salt baths, stretches, massage, Tylenol as needed.  Follow-up for worsening symptoms.    ED Prescriptions     Medication Sig Dispense Auth. Provider   tizanidine (ZANAFLEX) 2 MG capsule Take 1 capsule (2 mg total) by mouth 3 (three) times daily as needed for muscle spasms. Do not  drink alcohol or drive while taking this medication.  May cause drowsiness. 15 capsule Corbin Dess, [place]      PDMP not reviewed this encounter.   Corbin Dess, [place] 07/26/23 1037
